# Patient Record
Sex: Male | Born: 1940 | Race: White | Hispanic: No | Marital: Married | Smoking: Former smoker
Health system: Southern US, Community
[De-identification: ages and names within clinical notes are randomized; demographics above are authoritative.]

## PROBLEM LIST (undated history)

## (undated) DIAGNOSIS — C61 Malignant neoplasm of prostate: Secondary | ICD-10-CM

## (undated) DIAGNOSIS — Z87442 Personal history of urinary calculi: Secondary | ICD-10-CM

## (undated) DIAGNOSIS — I10 Essential (primary) hypertension: Secondary | ICD-10-CM

## (undated) DIAGNOSIS — T7840XA Allergy, unspecified, initial encounter: Secondary | ICD-10-CM

## (undated) DIAGNOSIS — H269 Unspecified cataract: Secondary | ICD-10-CM

## (undated) DIAGNOSIS — M545 Low back pain, unspecified: Secondary | ICD-10-CM

## (undated) DIAGNOSIS — D126 Benign neoplasm of colon, unspecified: Secondary | ICD-10-CM

## (undated) DIAGNOSIS — K648 Other hemorrhoids: Secondary | ICD-10-CM

## (undated) DIAGNOSIS — K760 Fatty (change of) liver, not elsewhere classified: Secondary | ICD-10-CM

## (undated) DIAGNOSIS — K4090 Unilateral inguinal hernia, without obstruction or gangrene, not specified as recurrent: Secondary | ICD-10-CM

## (undated) DIAGNOSIS — N400 Enlarged prostate without lower urinary tract symptoms: Secondary | ICD-10-CM

## (undated) DIAGNOSIS — N281 Cyst of kidney, acquired: Secondary | ICD-10-CM

## (undated) DIAGNOSIS — I700 Atherosclerosis of aorta: Secondary | ICD-10-CM

## (undated) DIAGNOSIS — K8020 Calculus of gallbladder without cholecystitis without obstruction: Secondary | ICD-10-CM

## (undated) HISTORY — DX: Atherosclerosis of aorta: I70.0

## (undated) HISTORY — PX: PROSTATE BIOPSY: SHX241

## (undated) HISTORY — DX: Personal history of urinary calculi: Z87.442

## (undated) HISTORY — DX: Calculus of gallbladder without cholecystitis without obstruction: K80.20

## (undated) HISTORY — DX: Low back pain, unspecified: M54.50

## (undated) HISTORY — DX: Essential (primary) hypertension: I10

## (undated) HISTORY — DX: Unspecified cataract: H26.9

## (undated) HISTORY — DX: Benign neoplasm of colon, unspecified: D12.6

## (undated) HISTORY — DX: Benign prostatic hyperplasia without lower urinary tract symptoms: N40.0

## (undated) HISTORY — DX: Unilateral inguinal hernia, without obstruction or gangrene, not specified as recurrent: K40.90

## (undated) HISTORY — PX: POLYPECTOMY: SHX149

## (undated) HISTORY — DX: Allergy, unspecified, initial encounter: T78.40XA

## (undated) HISTORY — PX: COLONOSCOPY: SHX174

## (undated) HISTORY — PX: OTHER SURGICAL HISTORY: SHX169

## (undated) HISTORY — DX: Cyst of kidney, acquired: N28.1

## (undated) HISTORY — PX: HEMORRHOID SURGERY: SHX153

## (undated) HISTORY — DX: Other hemorrhoids: K64.8

## (undated) HISTORY — DX: Low back pain: M54.5

## (undated) HISTORY — DX: Fatty (change of) liver, not elsewhere classified: K76.0

---

## 2003-03-05 LAB — HM COLONOSCOPY

## 2004-12-07 ENCOUNTER — Ambulatory Visit: Payer: Self-pay | Admitting: Internal Medicine

## 2004-12-13 ENCOUNTER — Ambulatory Visit: Payer: Self-pay | Admitting: Internal Medicine

## 2005-12-12 ENCOUNTER — Ambulatory Visit: Payer: Self-pay | Admitting: Internal Medicine

## 2005-12-14 ENCOUNTER — Ambulatory Visit: Payer: Self-pay | Admitting: Internal Medicine

## 2006-04-18 ENCOUNTER — Ambulatory Visit: Payer: Self-pay | Admitting: Internal Medicine

## 2007-06-17 ENCOUNTER — Ambulatory Visit: Payer: Self-pay | Admitting: Internal Medicine

## 2007-06-17 LAB — CONVERTED CEMR LAB
Alkaline Phosphatase: 47 units/L (ref 39–117)
BUN: 11 mg/dL (ref 6–23)
Basophils Relative: 0.1 % (ref 0.0–1.0)
CO2: 29 meq/L (ref 19–32)
Creatinine, Ser: 0.8 mg/dL (ref 0.4–1.5)
Eosinophils Relative: 2.9 % (ref 0.0–5.0)
HCT: 41.7 % (ref 39.0–52.0)
Hemoglobin: 14.6 g/dL (ref 13.0–17.0)
Leukocytes, UA: NEGATIVE
Monocytes Absolute: 0.5 10*3/uL (ref 0.2–0.7)
Monocytes Relative: 8.3 % (ref 3.0–11.0)
Neutrophils Relative %: 66.7 % (ref 43.0–77.0)
Nitrite: NEGATIVE
Potassium: 4.4 meq/L (ref 3.5–5.1)
RDW: 12.2 % (ref 11.5–14.6)
Specific Gravity, Urine: 1.02 (ref 1.000–1.03)
TSH: 1.67 microintl units/mL (ref 0.35–5.50)
Total Bilirubin: 0.9 mg/dL (ref 0.3–1.2)
Total Protein, Urine: NEGATIVE mg/dL
Total Protein: 6.8 g/dL (ref 6.0–8.3)
Urobilinogen, UA: 0.2 (ref 0.0–1.0)
VLDL: 9 mg/dL (ref 0–40)
WBC: 5.8 10*3/uL (ref 4.5–10.5)
pH: 6 (ref 5.0–8.0)

## 2007-06-18 ENCOUNTER — Encounter: Payer: Self-pay | Admitting: Internal Medicine

## 2007-06-18 DIAGNOSIS — M545 Low back pain, unspecified: Secondary | ICD-10-CM | POA: Insufficient documentation

## 2007-06-18 DIAGNOSIS — N400 Enlarged prostate without lower urinary tract symptoms: Secondary | ICD-10-CM

## 2007-06-18 DIAGNOSIS — Z87442 Personal history of urinary calculi: Secondary | ICD-10-CM | POA: Insufficient documentation

## 2007-06-18 DIAGNOSIS — I10 Essential (primary) hypertension: Secondary | ICD-10-CM

## 2007-06-18 DIAGNOSIS — Z8719 Personal history of other diseases of the digestive system: Secondary | ICD-10-CM | POA: Insufficient documentation

## 2007-09-26 DIAGNOSIS — B029 Zoster without complications: Secondary | ICD-10-CM | POA: Insufficient documentation

## 2007-09-27 ENCOUNTER — Ambulatory Visit: Payer: Self-pay | Admitting: Family Medicine

## 2008-03-03 ENCOUNTER — Ambulatory Visit: Payer: Self-pay | Admitting: Internal Medicine

## 2008-03-03 DIAGNOSIS — J019 Acute sinusitis, unspecified: Secondary | ICD-10-CM

## 2008-03-03 DIAGNOSIS — J309 Allergic rhinitis, unspecified: Secondary | ICD-10-CM

## 2008-06-29 ENCOUNTER — Ambulatory Visit: Payer: Self-pay | Admitting: Internal Medicine

## 2008-06-29 LAB — CONVERTED CEMR LAB
ALT: 21 units/L (ref 0–53)
Alkaline Phosphatase: 52 units/L (ref 39–117)
Bilirubin Urine: NEGATIVE
Bilirubin, Direct: 0.2 mg/dL (ref 0.0–0.3)
CO2: 30 meq/L (ref 19–32)
GFR calc Af Amer: 125 mL/min
Glucose, Bld: 104 mg/dL — ABNORMAL HIGH (ref 70–99)
HDL: 50.3 mg/dL (ref >39.0)
Hemoglobin, Urine: NEGATIVE
Leukocytes, UA: NEGATIVE
Lymphocytes Relative: 23.1 % (ref 12.0–46.0)
Monocytes Relative: 9.7 % (ref 3.0–12.0)
Neutrophils Relative %: 63.6 % (ref 43.0–77.0)
Nitrite: NEGATIVE
Platelets: 180 10*3/uL (ref 150–400)
Potassium: 4.5 meq/L (ref 3.5–5.1)
RDW: 12 % (ref 11.5–14.6)
Sodium: 142 meq/L (ref 135–145)
Total Bilirubin: 1 mg/dL (ref 0.3–1.2)
Total CHOL/HDL Ratio: 3
Total Protein: 6.7 g/dL (ref 6.0–8.3)
Urobilinogen, UA: 0.2 (ref 0.0–1.0)
VLDL: 13 mg/dL (ref 0–40)
pH: 6 (ref 5.0–8.0)

## 2008-07-06 ENCOUNTER — Ambulatory Visit: Payer: Self-pay | Admitting: Internal Medicine

## 2008-07-06 DIAGNOSIS — R42 Dizziness and giddiness: Secondary | ICD-10-CM | POA: Insufficient documentation

## 2009-06-02 ENCOUNTER — Telehealth: Payer: Self-pay | Admitting: Internal Medicine

## 2009-06-30 ENCOUNTER — Ambulatory Visit: Payer: Self-pay | Admitting: Internal Medicine

## 2010-06-29 ENCOUNTER — Encounter: Payer: Self-pay | Admitting: Internal Medicine

## 2010-06-29 ENCOUNTER — Ambulatory Visit: Payer: Self-pay | Admitting: Internal Medicine

## 2010-06-29 LAB — CONVERTED CEMR LAB
ALT: 21 units/L (ref 0–53)
AST: 32 units/L (ref 0–37)
Albumin: 4.3 g/dL (ref 3.5–5.2)
BUN: 15 mg/dL (ref 6–23)
Chloride: 105 meq/L (ref 96–112)
Cholesterol: 168 mg/dL (ref 0–200)
Eosinophils Relative: 2.5 % (ref 0.0–5.0)
GFR calc non Af Amer: 101.07 mL/min (ref >60)
Glucose, Bld: 92 mg/dL (ref 70–99)
HCT: 41.2 % (ref 39.0–52.0)
Hemoglobin: 14.3 g/dL (ref 13.0–17.0)
Leukocytes, UA: NEGATIVE
Lymphs Abs: 1.2 10*3/uL (ref 0.7–4.0)
MCV: 93.1 fL (ref 78.0–100.0)
Monocytes Absolute: 0.5 10*3/uL (ref 0.1–1.0)
Monocytes Relative: 10.6 % (ref 3.0–12.0)
Neutro Abs: 3.3 10*3/uL (ref 1.4–7.7)
Nitrite: NEGATIVE
PSA: 1.32 ng/mL (ref 0.10–4.00)
Platelets: 160 10*3/uL (ref 150.0–400.0)
Potassium: 5 meq/L (ref 3.5–5.1)
RDW: 13.1 % (ref 11.5–14.6)
Sodium: 140 meq/L (ref 135–145)
Specific Gravity, Urine: 1.01 (ref 1.000–1.030)
Total Bilirubin: 0.7 mg/dL (ref 0.3–1.2)
Total Protein, Urine: NEGATIVE mg/dL
Triglycerides: 35 mg/dL (ref 0.0–149.0)
pH: 5.5 (ref 5.0–8.0)

## 2010-06-30 ENCOUNTER — Encounter: Payer: Self-pay | Admitting: Internal Medicine

## 2010-06-30 ENCOUNTER — Telehealth (INDEPENDENT_AMBULATORY_CARE_PROVIDER_SITE_OTHER): Payer: Self-pay | Admitting: *Deleted

## 2010-07-04 ENCOUNTER — Ambulatory Visit: Payer: Self-pay

## 2010-07-04 ENCOUNTER — Encounter: Payer: Self-pay | Admitting: Internal Medicine

## 2010-11-12 LAB — CONVERTED CEMR LAB
Albumin: 4.2 g/dL (ref 3.5–5.2)
Alkaline Phosphatase: 46 units/L (ref 39–117)
Basophils Absolute: 0 10*3/uL (ref 0.0–0.1)
Basophils Relative: 0.8 % (ref 0.0–3.0)
CO2: 31 meq/L (ref 19–32)
Calcium: 9.1 mg/dL (ref 8.4–10.5)
Chloride: 105 meq/L (ref 96–112)
Cholesterol: 156 mg/dL (ref 0–200)
Eosinophils Absolute: 0.1 10*3/uL (ref 0.0–0.7)
Glucose, Bld: 98 mg/dL (ref 70–99)
HDL: 67.9 mg/dL (ref >39.00)
Hemoglobin: 14.3 g/dL (ref 13.0–17.0)
Ketones, ur: NEGATIVE mg/dL
Lymphocytes Relative: 21.2 % (ref 12.0–46.0)
Lymphs Abs: 1.1 10*3/uL (ref 0.7–4.0)
MCHC: 33.8 g/dL (ref 30.0–36.0)
MCV: 94.3 fL (ref 78.0–100.0)
Monocytes Absolute: 0.5 10*3/uL (ref 0.1–1.0)
Neutro Abs: 3.5 10*3/uL (ref 1.4–7.7)
RDW: 12.5 % (ref 11.5–14.6)
Sodium: 141 meq/L (ref 135–145)
Specific Gravity, Urine: <=1.005 (ref 1.000–1.030)
Total Protein, Urine: NEGATIVE mg/dL
Total Protein: 7.2 g/dL (ref 6.0–8.3)
Triglycerides: 45 mg/dL (ref 0.0–149.0)
Urine Glucose: NEGATIVE mg/dL
Urobilinogen, UA: 0.2 (ref 0.0–1.0)
VLDL: 9 mg/dL (ref 0.0–40.0)
pH: 6 (ref 5.0–8.0)

## 2010-11-14 NOTE — Miscellaneous (Signed)
Summary: Orders Update  Clinical Lists Changes  Orders: Added new Test order of Carotid Duplex (Carotid Duplex) - Signed 

## 2010-11-14 NOTE — Assessment & Plan Note (Signed)
Summary: FU---BP MEDS---STC   Vital Signs:  Patient profile:   70 year old male Height:      72 inches Weight:      194.38 pounds BMI:     26.46 O2 Sat:      97 % on Room air Temp:     98.3 degrees F oral Pulse rate:   61 / minute BP sitting:   120 / 68  (left arm) Cuff size:   regular  Vitals Entered By: Zella Ball Ewing CMA Duncan Dull) (June 29, 2010 9:34 AM)  O2 Flow:  Room air  CC: followup on BP medication/RE   CC:  followup on BP medication/RE.  History of Present Illness: overall doing well;  Pt denies CP, worsening sob, doe, wheezing, orthopnea, pnd, worsening LE edema, palps, or  syncope   Pt denies new neuro symptoms such as headache, facial or extremity weakness  No fever, wt loss, night sweats, loss of appetite or other constitutional symptoms .  Did have an unsusual episode of significant dizziness for 2 hrs last wk different from his usual vertigo, not assoc with CP or syncope, and without recurrence.  Due for med refill.  Preventive Screening-Counseling & Management      Drug Use:  no.    Problems Prior to Update: 1)  Dizziness  (ICD-780.4) 2)  Preventive Health Care  (ICD-V70.0) 3)  Vertigo  (ICD-780.4) 4)  Preventive Health Care  (ICD-V70.0) 5)  Nephrolithiasis, Hx of  (ICD-V13.01) 6)  Allergic Rhinitis  (ICD-477.9) 7)  Sinusitis- Acute-nos  (ICD-461.9) 8)  Herpes Zoster  (ICD-053.9) 9)  Benign Prostatic Hypertrophy  (ICD-600.00) 10)  Low Back Pain  (ICD-724.2) 11)  Hypertension  (ICD-401.9) 12)  Hemorrhoids, Hx of  (ICD-V12.79) 13)  Renal Calculus, Hx of  (ICD-V13.01)  Medications Prior to Update: 1)  Ramipril 5 Mg  Caps (Ramipril) .... Take 1 Tablet By Mouth Once A Day 2)  Adult Aspirin Low Strength 81 Mg  Tbdp (Aspirin) .... Take 1 Tablet By Mouth Once A Day  Current Medications (verified): 1)  Ramipril 5 Mg  Caps (Ramipril) .... Take 1 Tablet By Mouth Once A Day 2)  Adult Aspirin Low Strength 81 Mg  Tbdp (Aspirin) .... Take 1 Tablet By Mouth Once A  Day  Allergies (verified): 1)  ! * Ivp Dye  Past History:  Past Medical History: Last updated: 03/03/2008 Hypertension Low back pain Benign prostatic hypertrophy Allergic rhinitis Nephrolithiasis, hx of  Past Surgical History: Last updated: 03/03/2008 Denies surgical history  Family History: Last updated: 03/03/2008 COPD psoriasis CAD DM - mother renal cancer - grandfather  Social History: Last updated: 06/29/2010 semi-retired - [employer] - works part time but has full benefits Never Smoked Alcohol use-yes 2 children Married Drug use-no  Risk Factors: Smoking Status: never (03/03/2008)  Social History: Reviewed history from 03/03/2008 and no changes required. semi-retired - [employer] - works part time but has full benefits Never Smoked Alcohol use-yes 2 children Married Drug use-no Drug Use:  no  Review of Systems  The patient denies anorexia, fever, weight loss, weight gain, vision loss, decreased hearing, hoarseness, chest pain, syncope, dyspnea on exertion, peripheral edema, prolonged cough, headaches, hemoptysis, abdominal pain, melena, hematochezia, severe indigestion/heartburn, hematuria, muscle weakness, suspicious skin lesions, difficulty walking, depression, unusual weight change, abnormal bleeding, enlarged lymph nodes, and angioedema.         all otherwise negative per pt -    Physical Exam  General:  alert and overweight-appearing.   Head:  normocephalic  and atraumatic.   Eyes:  vision grossly intact, pupils equal, and pupils round.   Ears:  R ear normal and L ear normal.   Nose:  no external deformity and no nasal discharge.   Mouth:  no gingival abnormalities and pharynx pink and moist.   Neck:  supple and no masses.   Lungs:  normal respiratory effort and normal breath sounds.   Heart:  normal rate and regular rhythm.   Abdomen:  soft, non-tender, and normal bowel sounds.   Msk:  no joint tenderness and no joint swelling.   Extremities:  no  edema, no erythema  Neurologic:  cranial nerves II-XII intact and strength normal in all extremities.     Impression & Recommendations:  Problem # 1:  Preventive Health Care (ICD-V70.0)  Overall doing well, age appropriate education and counseling updated and referral for appropriate preventive services done unless declined, immunizations up to date or declined, diet counseling done if overweight, urged to quit smoking if smokes , most recent labs reviewed and current ordered if appropriate, ecg reviewed or declined (interpretation per ECG scanned in the EMR if done); information regarding Medicare Prevention requirements given if appropriate; speciality referrals updated as appropriate   Orders: TLB-BMP (Basic Metabolic Panel-BMET) (80048-METABOL) TLB-CBC Platelet - w/Differential (85025-CBCD) TLB-Hepatic/Liver Function Pnl (80076-HEPATIC) TLB-Lipid Panel (80061-LIPID) TLB-TSH (Thyroid Stimulating Hormone) (84443-TSH) TLB-PSA (Prostate Specific Antigen) (84153-PSA) TLB-Udip ONLY (81003-UDIP)  Problem # 2:  DIZZINESS (ICD-780.4)  differerent from his vertigo in the past , lasted 2 hrs and resolved 1 day last wk without assoc symtpoms or recurrence - for ecg and carotids   Orders: EKG w/ Interpretation (93000) Radiology Referral (Radiology)  Problem # 3:  HYPERTENSION (ICD-401.9)  His updated medication list for this problem includes:    Ramipril 5 Mg Caps (Ramipril) .Marland Kitchen... Take 1 tablet by mouth once a day  BP today: 120/68 Prior BP: 122/74 (06/30/2009)  Labs Reviewed: K+: 4.6 (06/30/2009) Creat: : 0.8 (06/30/2009)   Chol: 156 (06/30/2009)   HDL: 67.90 (06/30/2009)   LDL: 79 (06/30/2009)   TG: 45.0 (06/30/2009) stable overall by hx and exam, ok to continue meds/tx as is   Complete Medication List: 1)  Ramipril 5 Mg Caps (Ramipril) .... Take 1 tablet by mouth once a day 2)  Adult Aspirin Low Strength 81 Mg Tbdp (Aspirin) .... Take 1 tablet by mouth once a day  Other  Orders: Tdap => 15yrs IM (24401) Admin 1st Vaccine (02725)  Patient Instructions: 1)  you had the tetanus shot today 2)  Your EKG was Ok today 3)  You will be contacted about the referral(s) to: carotid ultrasound 4)  Please go to the Lab in the basement for your blood and/or urine tests today 5)  Please call the number on the [facility] Card for results of your testing 6)  You are given the refills on the medication today 7)  Please schedule a follow-up appointment in 1 year, or sooner if needed Prescriptions: RAMIPRIL 5 MG  CAPS (RAMIPRIL) Take 1 tablet by mouth once a day  #90 x 3   Entered and Authorized by:   Corwin Levins MD   Signed by:   Corwin Levins MD on 06/29/2010   Method used:   Print then Give to Patient   RxID:   [id number]    Immunizations Administered:  Tetanus Vaccine:    Vaccine Type: Tdap    Site: left deltoid    Mfr: GlaxoSmithKline    Dose: 0.5 ml  Route: IM    Given by: Zella Ball Ewing CMA (AAMA)    Exp. Date: 04/13/2012    Lot #: [id number]    VIS given: 09/01/08 version given June 29, 2010.

## 2010-11-14 NOTE — Progress Notes (Signed)
----   Converted from flag ---- ---- 06/29/2010 2:09 PM, Edman Circle wrote: appt 9/20@ 4:00  ---- 06/29/2010 1:54 PM, Dagoberto Reef wrote: Pt need carotid dopplers.   Thanks ------------------------------

## 2010-11-27 ENCOUNTER — Telehealth: Payer: Self-pay | Admitting: Internal Medicine

## 2010-12-06 NOTE — Progress Notes (Signed)
Summary: rx refill req  Phone Note Call from Patient Call back at Home Phone [phone]   Caller: Patient Summary of Call: Pt left message stating that he needs new rx for his Ramipril to go to [facility] pt, his insurance changed and [facility] does not have his information on file Initial call taken by: Brenton Grills CMA Duncan Dull),  November 27, 2010 2:47 PM  Follow-up for Phone Call        rx sent to [facility]-pt informed Follow-up by: Brenton Grills CMA Duncan Dull),  November 27, 2010 2:50 PM    Prescriptions: RAMIPRIL 5 MG  CAPS (RAMIPRIL) Take 1 tablet by mouth once a day  #90 x 1   Entered by:   Brenton Grills CMA (AAMA)   Authorized by:   Corwin Levins MD   Signed by:   Brenton Grills CMA (AAMA) on 11/27/2010   Method used:   Electronically to        [facility] (retail)             ,          Ph: [phone]       Fax: [phone]   RxID:   [id number]

## 2011-06-25 ENCOUNTER — Other Ambulatory Visit: Payer: Self-pay

## 2011-06-25 ENCOUNTER — Other Ambulatory Visit: Payer: Self-pay | Admitting: Internal Medicine

## 2011-06-25 MED ORDER — RAMIPRIL 5 MG PO CAPS: 5.0000 mg | ORAL_CAPSULE | Freq: Every day | ORAL | Status: DC

## 2011-08-10 ENCOUNTER — Encounter: Payer: Self-pay | Admitting: Internal Medicine

## 2011-08-10 DIAGNOSIS — Z0001 Encounter for general adult medical examination with abnormal findings: Secondary | ICD-10-CM | POA: Insufficient documentation

## 2011-08-10 DIAGNOSIS — Z0000 Encounter for general adult medical examination without abnormal findings: Secondary | ICD-10-CM | POA: Insufficient documentation

## 2011-08-13 ENCOUNTER — Other Ambulatory Visit (INDEPENDENT_AMBULATORY_CARE_PROVIDER_SITE_OTHER): Payer: Medicare Other

## 2011-08-13 ENCOUNTER — Ambulatory Visit (INDEPENDENT_AMBULATORY_CARE_PROVIDER_SITE_OTHER): Payer: Medicare Other | Admitting: Internal Medicine

## 2011-08-13 ENCOUNTER — Encounter: Payer: Self-pay | Admitting: Internal Medicine

## 2011-08-13 VITALS — BP 120/78 | HR 57 | Temp 97.9°F | Ht 72.0 in | Wt 192.0 lb

## 2011-08-13 DIAGNOSIS — I10 Essential (primary) hypertension: Secondary | ICD-10-CM

## 2011-08-13 DIAGNOSIS — Z23 Encounter for immunization: Secondary | ICD-10-CM

## 2011-08-13 DIAGNOSIS — R5381 Other malaise: Secondary | ICD-10-CM

## 2011-08-13 DIAGNOSIS — R5383 Other fatigue: Secondary | ICD-10-CM | POA: Insufficient documentation

## 2011-08-13 DIAGNOSIS — J309 Allergic rhinitis, unspecified: Secondary | ICD-10-CM

## 2011-08-13 DIAGNOSIS — N320 Bladder-neck obstruction: Secondary | ICD-10-CM | POA: Insufficient documentation

## 2011-08-13 DIAGNOSIS — M545 Low back pain: Secondary | ICD-10-CM

## 2011-08-13 LAB — URINALYSIS, ROUTINE W REFLEX MICROSCOPIC
Bilirubin Urine: NEGATIVE
Ketones, ur: NEGATIVE
Urine Glucose: NEGATIVE
Urobilinogen, UA: 0.2 (ref 0.0–1.0)

## 2011-08-13 LAB — BASIC METABOLIC PANEL
CO2: 26 mEq/L (ref 19–32)
Calcium: 9.1 mg/dL (ref 8.4–10.5)
Creatinine, Ser: 0.9 mg/dL (ref 0.4–1.5)

## 2011-08-13 LAB — LIPID PANEL
HDL: 70 mg/dL (ref >39.00)
LDL Cholesterol: 80 mg/dL (ref 0–99)
Total CHOL/HDL Ratio: 2
Triglycerides: 45 mg/dL (ref 0.0–149.0)
VLDL: 9 mg/dL (ref 0.0–40.0)

## 2011-08-13 LAB — CBC WITH DIFFERENTIAL/PLATELET
Basophils Relative: 0.6 % (ref 0.0–3.0)
Eosinophils Absolute: 0.2 10*3/uL (ref 0.0–0.7)
Hemoglobin: 13.8 g/dL (ref 13.0–17.0)
Lymphocytes Relative: 13.5 % (ref 12.0–46.0)
MCHC: 34.2 g/dL (ref 30.0–36.0)
Neutro Abs: 5 10*3/uL (ref 1.4–7.7)
RBC: 4.28 Mil/uL (ref 4.22–5.81)

## 2011-08-13 LAB — HEPATIC FUNCTION PANEL
Bilirubin, Direct: 0.2 mg/dL (ref 0.0–0.3)
Total Bilirubin: 0.7 mg/dL (ref 0.3–1.2)
Total Protein: 6.7 g/dL (ref 6.0–8.3)

## 2011-08-13 MED ORDER — RAMIPRIL 5 MG PO CAPS: 5.0000 mg | ORAL_CAPSULE | Freq: Every day | ORAL | Status: DC

## 2011-08-13 NOTE — Assessment & Plan Note (Addendum)
stable overall by hx and exam, most recent data reviewed with pt, and pt to continue medical treatment as before  BP Readings from Last 3 Encounters:  08/13/11 120/78  06/29/10 120/68  06/30/09 122/74   ECG reviewed as per emr

## 2011-08-13 NOTE — Patient Instructions (Addendum)
You had the flu shot today Please go to LAB in the Basement for the blood and/or urine tests to be done today Please call the phone number [phone] (the PhoneTree System) for results of testing in 2-3 days;  When calling, simply dial the number, and when prompted enter the MRN number above (the Medical Record Number) and the # key, then the message should start. Your medication was refilled to the pharmacy today - [facility] Please return in 1 year for your yearly visit, or sooner if needed

## 2011-08-13 NOTE — Assessment & Plan Note (Signed)
stable overall by hx and exam, most recent data reviewed with pt, and pt to continue medical treatment as before , OK for otc allegra prn

## 2011-08-13 NOTE — Progress Notes (Signed)
  Subjective:    Patient ID: Austin [name], male    DOB: Jan 12, 1941, 70 y.o.   MRN: [id number]  HPI  Here to f/u; overall doing well, Does have sense of ongoing fatigue, but denies signficant hypersomnolence.  Pt denies chest pain, increased sob or doe, wheezing, orthopnea, PND, increased LE swelling, palpitations, dizziness or syncope.  Pt denies new neurological symptoms such as new headache, or facial or extremity weakness or numbness    Pt denies polydipsia, polyuria.  Overall good compliance with treatment, and good medicine tolerability.   Pt denies fever, wt loss, night sweats, loss of appetite, or other constitutional symptoms  Allergy symptoms minimal this season, and has not had to use OTC meds.  Pt continues to have recurring but mild, infreqeunt LBP without change in severity, bowel or bladder change, fever, wt loss,  worsening LE pain/numbness/weakness, gait change or falls. Past Medical History  Diagnosis Date  . Hypertension   . Low back pain   . BPH (benign prostatic hypertrophy)   . Allergy   . History of nephrolithiasis    Past Surgical History  Procedure Date  . None     reports that he has never smoked. He does not have any smokeless tobacco history on file. He reports that he drinks alcohol. He reports that he does not use illicit drugs. family history includes COPD in his other; Cancer in his other; Diabetes in his mother; Heart disease in his other; and Psoriasis in his other. No Known Allergies Current Outpatient Prescriptions on File Prior to Visit  Medication Sig Dispense Refill  . aspirin 81 MG tablet Take 81 mg by mouth daily.          Review of Systems Review of Systems  Constitutional: Negative for diaphoresis and unexpected weight change.  HENT: Negative for drooling and tinnitus.   Eyes: Negative for photophobia and visual disturbance.  Respiratory: Negative for choking and stridor.   Gastrointestinal: Negative for vomiting and blood in stool.    Genitourinary: Negative for hematuria and decreased urine volume.  Musculoskeletal: Negative for gait problem.  Skin: Negative for color change and wound.  Neurological: Negative for tremors and numbness.  Psychiatric/Behavioral: Negative for decreased concentration. The patient is not hyperactive.      Objective:   Physical Exam BP 120/78  Pulse 57  Temp(Src) 97.9 F (36.6 C) (Oral)  Ht 6' (1.829 m)  Wt 192 lb (87.091 kg)  BMI 26.04 kg/m2  SpO2 97% Physical Exam  VS noted Constitutional: Pt appears well-developed and well-nourished.  HENT: Head: Normocephalic.  Right Ear: External ear normal.  Left Ear: External ear normal.  Eyes: Conjunctivae and EOM are normal. Pupils are equal, round, and reactive to light.  Neck: Normal range of motion. Neck supple.  Cardiovascular: Normal rate and regular rhythm.   Pulmonary/Chest: Effort normal and breath sounds normal.  Abd:  Soft, NT, non-distended, + BS Neurological: Pt is alert. No cranial nerve deficit.  Skin: Skin is warm. No erythema.  Psychiatric: Pt behavior is normal. Thought content normal.     Assessment & Plan:

## 2011-08-13 NOTE — Assessment & Plan Note (Signed)
asymtp - for PSA and UA today,  to f/u any worsening symptoms or concerns

## 2011-08-13 NOTE — Assessment & Plan Note (Signed)
Etiology unclear, Exam otherwise benign, to check labs as documented, follow with expectant management  

## 2011-08-13 NOTE — Assessment & Plan Note (Signed)
stable overall by hx and exam, , and pt to continue medical treatment as before   

## 2011-11-16 ENCOUNTER — Telehealth: Payer: Self-pay

## 2011-11-16 NOTE — Telephone Encounter (Signed)
I would try the OTC meds first, such as Delsym, or mucinex otc prn for cough and congestion, as this often helps

## 2011-11-16 NOTE — Telephone Encounter (Signed)
Patient informed. 

## 2011-11-16 NOTE — Telephone Encounter (Signed)
Patient has cough and congestion. He would like cough med. And antibiotic if possible to [facility] [address] [phone]

## 2011-11-22 ENCOUNTER — Other Ambulatory Visit: Payer: Self-pay

## 2011-11-22 ENCOUNTER — Telehealth: Payer: Self-pay | Admitting: Internal Medicine

## 2011-11-22 MED ORDER — AZITHROMYCIN 250 MG PO TABS: ORAL_TABLET | ORAL | Status: AC

## 2011-11-22 MED ORDER — HYDROCODONE-HOMATROPINE 5-1.5 MG/5ML PO SYRP: 5.0000 mL | ORAL_SOLUTION | Freq: Four times a day (QID) | ORAL | Status: AC | PRN

## 2011-11-22 MED ORDER — AZITHROMYCIN 250 MG PO TABS: ORAL_TABLET | ORAL | Status: DC

## 2011-11-22 NOTE — Telephone Encounter (Signed)
Faxed hardcopy to pharmacy. Called the patient to inform and he is also requesting an antibiotic as well. Stated he has no fever, chest burning due to all the coughing. Please advise

## 2011-11-22 NOTE — Telephone Encounter (Signed)
Done hardcopy to robin  

## 2011-11-22 NOTE — Telephone Encounter (Signed)
The pt called and is hoping to get a rx for a cough syrup.  He states he tried an Secretary/administrator and it did not work.  Thanks!

## 2011-11-22 NOTE — Telephone Encounter (Signed)
Done per emr 

## 2011-11-22 NOTE — Telephone Encounter (Signed)
Informed the patient sent in.

## 2011-12-07 ENCOUNTER — Telehealth: Payer: Self-pay | Admitting: Internal Medicine

## 2011-12-07 MED ORDER — HYDROCODONE-HOMATROPINE 5-1.5 MG/5ML PO SYRP: 5.0000 mL | ORAL_SOLUTION | Freq: Four times a day (QID) | ORAL | Status: AC | PRN

## 2011-12-07 NOTE — Telephone Encounter (Signed)
Faxed hardcopy to pharmacy and informed the patient as well. 

## 2011-12-07 NOTE — Telephone Encounter (Signed)
Pt just seen feb 7  Ok this time only

## 2011-12-07 NOTE — Telephone Encounter (Signed)
Pt requesting hydrocodone -homatropine syrup refill--call in to  [facility] Ph#  [phone]

## 2012-07-21 ENCOUNTER — Other Ambulatory Visit (INDEPENDENT_AMBULATORY_CARE_PROVIDER_SITE_OTHER): Payer: Medicare Other

## 2012-07-21 ENCOUNTER — Ambulatory Visit (INDEPENDENT_AMBULATORY_CARE_PROVIDER_SITE_OTHER): Payer: Medicare Other | Admitting: Internal Medicine

## 2012-07-21 ENCOUNTER — Encounter: Payer: Self-pay | Admitting: Internal Medicine

## 2012-07-21 VITALS — BP 112/70 | HR 64 | Temp 97.0°F | Ht 72.0 in | Wt 188.2 lb

## 2012-07-21 DIAGNOSIS — Z23 Encounter for immunization: Secondary | ICD-10-CM

## 2012-07-21 DIAGNOSIS — Z0000 Encounter for general adult medical examination without abnormal findings: Secondary | ICD-10-CM

## 2012-07-21 DIAGNOSIS — Z125 Encounter for screening for malignant neoplasm of prostate: Secondary | ICD-10-CM

## 2012-07-21 DIAGNOSIS — Z79899 Other long term (current) drug therapy: Secondary | ICD-10-CM

## 2012-07-21 DIAGNOSIS — J019 Acute sinusitis, unspecified: Secondary | ICD-10-CM

## 2012-07-21 DIAGNOSIS — N400 Enlarged prostate without lower urinary tract symptoms: Secondary | ICD-10-CM

## 2012-07-21 LAB — URINALYSIS, ROUTINE W REFLEX MICROSCOPIC
Leukocytes, UA: NEGATIVE
Nitrite: NEGATIVE
Specific Gravity, Urine: 1.02 (ref 1.000–1.030)
Total Protein, Urine: NEGATIVE
pH: 6.5 (ref 5.0–8.0)

## 2012-07-21 MED ORDER — RAMIPRIL 5 MG PO CAPS: 5.0000 mg | ORAL_CAPSULE | Freq: Every day | ORAL | Status: DC

## 2012-07-21 MED ORDER — TAMSULOSIN HCL 0.4 MG PO CAPS: 0.4000 mg | ORAL_CAPSULE | Freq: Every day | ORAL | Status: DC

## 2012-07-21 MED ORDER — PNEUMOCOCCAL VAC POLYVALENT 25 MCG/0.5ML IJ INJ: 0.5000 mL | INJECTION | Freq: Once | INTRAMUSCULAR | Status: DC

## 2012-07-21 MED ORDER — LEVOFLOXACIN 250 MG PO TABS: 250.0000 mg | ORAL_TABLET | Freq: Every day | ORAL | Status: DC

## 2012-07-21 NOTE — Patient Instructions (Addendum)
You had the flu shot today, and the pneumonia shot Please check with your insurance about the shingles shot, and if covered, please return for nurse visit to have the shot Take all new medications as prescribed - the levaquin antibiotic, and the flomax for prostate Continue all other medications as before Please have the pharmacy call with any refills you may need. Please go to LAB in the Basement for the blood and/or urine tests to be done today You will be contacted by phone if any changes need to be made immediately.  Otherwise, you will receive a letter about your results with an explanation. Please remember to sign up for My Chart at your earliest convenience, as this will be important to you in the future with finding out test results. Please return in 1 year for your yearly visit, or sooner if needed, with Lab testing done 3-5 days before

## 2012-07-22 ENCOUNTER — Encounter: Payer: Self-pay | Admitting: Internal Medicine

## 2012-07-22 LAB — BASIC METABOLIC PANEL
BUN: 18 mg/dL (ref 6–23)
Calcium: 8.9 mg/dL (ref 8.4–10.5)
Creatinine, Ser: 0.9 mg/dL (ref 0.4–1.5)
GFR: 88.95 mL/min (ref >60.00)
Glucose, Bld: 78 mg/dL (ref 70–99)
Potassium: 4.9 mEq/L (ref 3.5–5.1)

## 2012-07-22 LAB — CBC WITH DIFFERENTIAL/PLATELET
Basophils Relative: 0.4 % (ref 0.0–3.0)
Eosinophils Relative: 3.1 % (ref 0.0–5.0)
HCT: 39.4 % (ref 39.0–52.0)
Lymphs Abs: 1.2 10*3/uL (ref 0.7–4.0)
MCV: 95.8 fl (ref 78.0–100.0)
Monocytes Relative: 8.1 % (ref 3.0–12.0)
Platelets: 190 10*3/uL (ref 150.0–400.0)
RBC: 4.11 Mil/uL — ABNORMAL LOW (ref 4.22–5.81)
WBC: 7.8 10*3/uL (ref 4.5–10.5)

## 2012-07-22 LAB — LIPID PANEL
Cholesterol: 147 mg/dL (ref 0–200)
HDL: 58.3 mg/dL (ref >39.00)
LDL Cholesterol: 74 mg/dL (ref 0–99)
Triglycerides: 75 mg/dL (ref 0.0–149.0)
VLDL: 15 mg/dL (ref 0.0–40.0)

## 2012-07-22 LAB — HEPATIC FUNCTION PANEL
AST: 20 U/L (ref 0–37)
Albumin: 3.8 g/dL (ref 3.5–5.2)
Total Bilirubin: 0.5 mg/dL (ref 0.3–1.2)

## 2012-07-22 LAB — PSA: PSA: 2.25 ng/mL (ref 0.10–4.00)

## 2012-07-22 LAB — TSH: TSH: 1.78 u[IU]/mL (ref 0.35–5.50)

## 2012-07-24 ENCOUNTER — Encounter: Payer: Self-pay | Admitting: Internal Medicine

## 2012-07-24 NOTE — Progress Notes (Signed)
Subjective:    Patient ID: Austin [name], male    DOB: [birth year], 71 y.o.   MRN: [id number]  HPI  Here for wellness and f/u;  Overall doing ok;  Pt denies CP, worsening SOB, DOE, wheezing, orthopnea, PND, worsening LE edema, palpitations, dizziness or syncope.  Pt denies neurological change such as new Headache, facial or extremity weakness.  Pt denies polydipsia, polyuria, or low sugar symptoms. Pt states overall good compliance with treatment and medications, good tolerability, and trying to follow lower cholesterol diet.  Pt denies worsening depressive symptoms, suicidal ideation or panic. No fever, wt loss, night sweats, loss of appetite, or other constitutional symptoms.  Pt states good ability with ADL's, low fall risk, home safety reviewed and adequate, no significant changes in hearing or vision, and occasionally active with exercise.   Here with 3 days acute onset fever, facial pain, pressure, general weakness and malaise, and greenish d/c, with slight ST, but little to no cough.  Has some urinary stream slowing , worse in the AM , some small better later in the day Past Medical History  Diagnosis Date  . Hypertension   . Low back pain   . BPH (benign prostatic hypertrophy)   . Allergy   . History of nephrolithiasis    Past Surgical History  Procedure Date  . None     reports that he has never smoked. He does not have any smokeless tobacco history on file. He reports that he drinks alcohol. He reports that he does not use illicit drugs. family history includes COPD in his other; Cancer in his other; Diabetes in his mother; Heart disease in his other; and Psoriasis in his other. No Known Allergies Current Outpatient Prescriptions on File Prior to Visit  Medication Sig Dispense Refill  . aspirin 81 MG tablet Take 81 mg by mouth daily.        . ramipril (ALTACE) 5 MG capsule Take 1 capsule (5 mg total) by mouth daily.  90 capsule  3   No current facility-administered medications on  file prior to visit.   Review of Systems Review of Systems  Constitutional: Negative for diaphoresis, activity change, appetite change and unexpected weight change.  HENT: Negative for hearing loss, ear pain, facial swelling, mouth sores and neck stiffness.   Eyes: Negative for pain, redness and visual disturbance.  Respiratory: Negative for shortness of breath and wheezing.   Cardiovascular: Negative for chest pain and palpitations.  Gastrointestinal: Negative for diarrhea, blood in stool, abdominal distention and rectal pain.  Genitourinary: Negative for hematuria, flank pain and decreased urine volume.  Musculoskeletal: Negative for myalgias and joint swelling.  Skin: Negative for color change and wound.  Neurological: Negative for syncope and numbness.  Hematological: Negative for adenopathy.  Psychiatric/Behavioral: Negative for hallucinations, self-injury, decreased concentration and agitation.      Objective:   Physical Exam BP 112/70  Pulse 64  Temp 97 F (36.1 C) (Oral)  Ht 6' (1.829 m)  Wt 188 lb 4 oz (85.39 kg)  BMI 25.53 kg/m2  SpO2 97% Physical Exam  VS noted Constitutional: Pt is oriented to person, place, and time. Appears well-developed and well-nourished.  HENT:  Head: Normocephalic and atraumatic.  Right Ear: External ear normal.  Left Ear: External ear normal.  Nose: Nose normal.  Mouth/Throat: Oropharynx is clear and moist.  Bilat tm's mild erythema.  Sinus tender bilat.  Pharynx mild erythema Eyes: Conjunctivae and EOM are normal. Pupils are equal, round, and reactive to light.  Neck: Normal range of motion. Neck supple. No JVD present. No tracheal deviation present.  Cardiovascular: Normal rate, regular rhythm, normal heart sounds and intact distal pulses.   Pulmonary/Chest: Effort normal and breath sounds normal.  Abdominal: Soft. Bowel sounds are normal. There is no tenderness.  Musculoskeletal: Normal range of motion. Exhibits no edema.    Lymphadenopathy:  Has no cervical adenopathy.  Neurological: Pt is alert and oriented to person, place, and time. Pt has normal reflexes. No cranial nerve deficit.  Skin: Skin is warm and dry. No rash noted.  Psychiatric:  Has  normal mood and affect. Behavior is normal.     Assessment & Plan:

## 2012-07-24 NOTE — Assessment & Plan Note (Signed)

## 2012-07-24 NOTE — Assessment & Plan Note (Signed)
Mild to mod, for antibx course,  to f/u any worsening symptoms or concerns 

## 2012-07-24 NOTE — Assessment & Plan Note (Signed)
For flomax trial - .4 qd

## 2013-07-22 ENCOUNTER — Encounter: Payer: Self-pay | Admitting: Family Medicine

## 2013-07-22 ENCOUNTER — Ambulatory Visit (INDEPENDENT_AMBULATORY_CARE_PROVIDER_SITE_OTHER): Payer: Medicare Other | Admitting: Internal Medicine

## 2013-07-22 ENCOUNTER — Ambulatory Visit (INDEPENDENT_AMBULATORY_CARE_PROVIDER_SITE_OTHER): Payer: Medicare Other | Admitting: Family Medicine

## 2013-07-22 ENCOUNTER — Encounter: Payer: Self-pay | Admitting: Internal Medicine

## 2013-07-22 VITALS — BP 122/80 | HR 65 | Wt 189.0 lb

## 2013-07-22 VITALS — BP 122/80 | HR 65 | Temp 97.4°F | Ht 73.0 in | Wt 189.2 lb

## 2013-07-22 DIAGNOSIS — M25561 Pain in right knee: Secondary | ICD-10-CM

## 2013-07-22 DIAGNOSIS — Z0000 Encounter for general adult medical examination without abnormal findings: Secondary | ICD-10-CM

## 2013-07-22 DIAGNOSIS — Z23 Encounter for immunization: Secondary | ICD-10-CM

## 2013-07-22 DIAGNOSIS — M25569 Pain in unspecified knee: Secondary | ICD-10-CM

## 2013-07-22 DIAGNOSIS — Z136 Encounter for screening for cardiovascular disorders: Secondary | ICD-10-CM

## 2013-07-22 MED ORDER — RAMIPRIL 5 MG PO CAPS: 5.0000 mg | ORAL_CAPSULE | Freq: Every day | ORAL | Status: DC

## 2013-07-22 MED ORDER — MELOXICAM 15 MG PO TABS: 15.0000 mg | ORAL_TABLET | Freq: Every day | ORAL | Status: DC

## 2013-07-22 NOTE — Assessment & Plan Note (Addendum)
Patient's right knee pain seems to be nonspecific. Patient does have medial joint line pain but on ultrasound no signs of meniscal injury or on physical exam that would be legitimate concern. I think patient did just tweaked his knee and does have a small effusion that seems to be resolving. Anti-inflammatories x10 days Icing Brace was given and fitted by me today. Patient will come back again in 3-4 weeks the pain is not completely resolved and we'll try a corticosteroid injection.

## 2013-07-22 NOTE — Progress Notes (Signed)
Subjective:    Patient ID: Austin [name], male    DOB: 08/21/1941, 72 y.o.   MRN: [id number]  HPI  Here for wellness and f/u;  Overall doing ok;  Pt denies CP, worsening SOB, DOE, wheezing, orthopnea, PND, worsening LE edema, palpitations, dizziness or syncope.  Pt denies neurological change such as new headache, facial or extremity weakness.  Pt denies polydipsia, polyuria, or low sugar symptoms. Pt states overall good compliance with treatment and medications, good tolerability, and has been trying to follow lower cholesterol diet.  Pt denies worsening depressive symptoms, suicidal ideation or panic. No fever, night sweats, wt loss, loss of appetite, or other constitutional symptoms.  Pt states good ability with ADL's, has low fall risk, home safety reviewed and adequate, no other significant changes in hearing or vision, and only occasionally active with exercise.  Also back from [place] recently, now with 2 wks onset right knee pain/swelling and feeling of instability with walking up on a curb. No falls Past Medical History  Diagnosis Date  . Hypertension   . Low back pain   . BPH (benign prostatic hypertrophy)   . Allergy   . History of nephrolithiasis    Past Surgical History  Procedure Laterality Date  . None      reports that he has never smoked. He does not have any smokeless tobacco history on file. He reports that he drinks alcohol. He reports that he does not use illicit drugs. family history includes COPD in his other; Cancer in his other; Diabetes in his mother; Heart disease in his other; Psoriasis in his other. No Known Allergies Current Outpatient Prescriptions on File Prior to Visit  Medication Sig Dispense Refill  . aspirin 81 MG tablet Take 81 mg by mouth daily.         No current facility-administered medications on file prior to visit.   Review of Systems Constitutional: Negative for diaphoresis, activity change, appetite change or unexpected weight change.  HENT:  Negative for hearing loss, ear pain, facial swelling, mouth sores and neck stiffness.   Eyes: Negative for pain, redness and visual disturbance.  Respiratory: Negative for shortness of breath and wheezing.   Cardiovascular: Negative for chest pain and palpitations.  Gastrointestinal: Negative for diarrhea, blood in stool, abdominal distention or other pain Genitourinary: Negative for hematuria, flank pain or change in urine volume.  Musculoskeletal: Negative for myalgias and joint swelling.  Skin: Negative for color change and wound.  Neurological: Negative for syncope and numbness. other than noted Hematological: Negative for adenopathy.  Psychiatric/Behavioral: Negative for hallucinations, self-injury, decreased concentration and agitation.      Objective:   Physical Exam BP 122/80  Pulse 65  Temp(Src) 97.4 F (36.3 C) (Oral)  Ht 6\' 1"  (1.854 m)  Wt 189 lb 4 oz (85.843 kg)  BMI 24.97 kg/m2  SpO2 97% VS noted,  Constitutional: Pt is oriented to person, place, and time. Appears well-developed and well-nourished.  Head: Normocephalic and atraumatic.  Right Ear: External ear normal.  Left Ear: External ear normal.  Nose: Nose normal.  Mouth/Throat: Oropharynx is clear and moist.  Eyes: Conjunctivae and EOM are normal. Pupils are equal, round, and reactive to light.  Neck: Normal range of motion. Neck supple. No JVD present. No tracheal deviation present.  Cardiovascular: Normal rate, regular rhythm, normal heart sounds and intact distal pulses.   Pulmonary/Chest: Effort normal and breath sounds normal.  Abdominal: Soft. Bowel sounds are normal. There is no tenderness. No HSM  Musculoskeletal:  Normal range of motion. Exhibits no edema.  Lymphadenopathy:  Has no cervical adenopathy.  Neurological: Pt is alert and oriented to person, place, and time. Pt has normal reflexes. No cranial nerve deficit.  Skin: Skin is warm and dry. No rash noted.  Psychiatric:  Has  normal mood and  affect. Behavior is normal.  Right knee with 1+ effusion, NT, decrsaed ROM    Assessment & Plan:

## 2013-07-22 NOTE — Assessment & Plan Note (Signed)

## 2013-07-22 NOTE — Patient Instructions (Addendum)
You had the Prevnar pneumonia shot, and the flu shot Please continue all other medications as before, and refills have been done if requested. Please have the pharmacy call with any other refills you may need. Please continue your efforts at being more active, low cholesterol diet, and weight control. You are otherwise up to date with prevention measures today. Please keep your appointments with your specialists as you may have planned  Please see the front desk now to be registered to see Dr Katrinka Blazing now for the right knee pain and swelling  Please go to the LAB in the Basement (turn left off the elevator) for the tests to be done at your convenience  Your EKG was OK today  You will be contacted by phone if any changes need to be made immediately.  Otherwise, you will receive a letter about your results with an explanation, but please check with MyChart first.  Please return in 1 year for your yearly visit, or sooner if needed, with Lab testing done 3-5 days before

## 2013-07-22 NOTE — Addendum Note (Signed)
Addended by: Scharlene Gloss B on: 07/22/2013 04:29 PM   Modules accepted: Orders

## 2013-07-22 NOTE — Progress Notes (Signed)
  I'm seeing this patient by the request  of:  Oliver Barre, MD  CC: Right knee pain  HPI: Patient is a 72 year old gentleman coming in with right knee pain for 3 weeks duration. Patient's 3 weeks ago was in [place] and came off a curb incorrectly. Patient the like he hyperextended his knee. Patient states that he continues to have pain when he is going downhill or going downstairs. Otherwise regular walking seems to give him no trouble. Patient denies any locking popping or giving on him. Patient denies any radiation of pain, numbness or any swelling in the knee. Patient states that he has more of a dull aching sensation that he would rate 5/10 pain. Patient denies any nighttime awakening. Patient has not done any home modalities at this time.  Past medical, surgical, family and social history reviewed. Medications reviewed all in the electronic medical record.   Review of Systems: No headache, visual changes, nausea, vomiting, diarrhea, constipation, dizziness, abdominal pain, skin rash, fevers, chills, night sweats, weight loss, swollen lymph nodes, body aches, joint swelling, muscle aches, chest pain, shortness of breath, mood changes.   Objective:    Blood pressure 122/80, pulse 65, weight 189 lb (85.73 kg), SpO2 97.00%.   General: No apparent distress alert and oriented x3 mood and affect normal, dressed appropriately.  HEENT: Pupils equal, extraocular movements intact Respiratory: Patient's speak in full sentences and does not appear short of breath Cardiovascular: No lower extremity edema, non tender, no erythema Skin: Warm dry intact with no signs of infection or rash on extremities or on axial skeleton. Abdomen: Soft nontender Neuro: Cranial nerves II through XII are intact, neurovascularly intact in all extremities with 2+ DTRs and 2+ pulses. Lymph: No lymphadenopathy of posterior or anterior cervical chain or axillae bilaterally.  Gait normal with good balance and coordination.   MSK: Non tender with full range of motion and good stability and symmetric strength and tone of shoulders, elbows, wrist, hip, and ankles bilaterally.  Knee: Right Normal to inspection with no erythema or obvious bony abnormalities. Patient does though have a trace effusion of this knee compared to the contralateral side. Palpation normal with no warmth, but does have mild medial joint line tenderness No patellar tenderness, or condyle tenderness. ROM full in flexion and extension and lower leg rotation. Ligaments with solid consistent endpoints including ACL, PCL, LCL, MCL. Negative Mcmurray's, Apley's, and Thessalonian tests. Non painful patellar compression. Patellar glide without crepitus. Patellar and quadriceps tendons unremarkable. Hamstring and quadriceps strength is normal.   MSK US performed of: Right knee This study was ordered, performed, and interpreted by Terrilee Files D.O.  Knee: All structures visualized. Anteromedial, anterolateral, posteromedial, and posterolateral menisci unremarkable without tearing, fraying, effusion, or displacement. Patient does have chronic degenerative changes the medial meniscus but no acute tear appreciated Patellar Tendon unremarkable on long and transverse views without effusion. No abnormality of prepatellar bursa. Patient does have trace effusion of the knee extending in the suprapatellar pouch LCL and MCL unremarkable on long and transverse views. No abnormality of origin of medial or lateral head of the gastrocnemius.  IMPRESSION:  Nonspecific knee swelling with no signs of meniscal injury.    Impression and Recommendations:     This case required medical decision making of moderate complexity.

## 2013-07-22 NOTE — Assessment & Plan Note (Signed)
To see Dr Katrinka Blazing in our office today/sports med

## 2013-07-22 NOTE — Patient Instructions (Addendum)
Very nice to meet you Next time you come I want pictures of your trip Ice 20 minute 2 times a day Exercises daily Wear the brace with activity and 30 minutes afterwards Meloxicam daily for 10 days then as needed Come back in 3-4 weeks.

## 2013-07-23 ENCOUNTER — Other Ambulatory Visit: Payer: Self-pay | Admitting: Internal Medicine

## 2013-07-23 ENCOUNTER — Other Ambulatory Visit (INDEPENDENT_AMBULATORY_CARE_PROVIDER_SITE_OTHER): Payer: Medicare Other

## 2013-07-23 DIAGNOSIS — Z0000 Encounter for general adult medical examination without abnormal findings: Secondary | ICD-10-CM

## 2013-07-23 DIAGNOSIS — I10 Essential (primary) hypertension: Secondary | ICD-10-CM

## 2013-07-23 DIAGNOSIS — R972 Elevated prostate specific antigen [PSA]: Secondary | ICD-10-CM

## 2013-07-23 LAB — URINALYSIS, ROUTINE W REFLEX MICROSCOPIC
Bilirubin Urine: NEGATIVE
Hgb urine dipstick: NEGATIVE
Leukocytes, UA: NEGATIVE
Nitrite: NEGATIVE
Specific Gravity, Urine: 1.01 (ref 1.000–1.030)
Total Protein, Urine: NEGATIVE
Urobilinogen, UA: 0.2 (ref 0.0–1.0)
WBC, UA: NONE SEEN (ref 0–?)

## 2013-07-23 LAB — CBC WITH DIFFERENTIAL/PLATELET
Basophils Absolute: 0 10*3/uL (ref 0.0–0.1)
Basophils Relative: 0.5 % (ref 0.0–3.0)
Eosinophils Absolute: 0.3 10*3/uL (ref 0.0–0.7)
Lymphocytes Relative: 18.1 % (ref 12.0–46.0)
MCHC: 33.9 g/dL (ref 30.0–36.0)
Neutrophils Relative %: 68.3 % (ref 43.0–77.0)
Platelets: 175 10*3/uL (ref 150.0–400.0)
RBC: 4.36 Mil/uL (ref 4.22–5.81)
WBC: 6.1 10*3/uL (ref 4.5–10.5)

## 2013-07-23 LAB — LIPID PANEL
Cholesterol: 164 mg/dL (ref 0–200)
LDL Cholesterol: 90 mg/dL (ref 0–99)
Triglycerides: 35 mg/dL (ref 0.0–149.0)
VLDL: 7 mg/dL (ref 0.0–40.0)

## 2013-07-23 LAB — HEPATIC FUNCTION PANEL
ALT: 15 U/L (ref 0–53)
AST: 20 U/L (ref 0–37)
Bilirubin, Direct: 0.1 mg/dL (ref 0.0–0.3)
Total Bilirubin: 0.9 mg/dL (ref 0.3–1.2)
Total Protein: 6.4 g/dL (ref 6.0–8.3)

## 2013-07-23 LAB — BASIC METABOLIC PANEL
BUN: 12 mg/dL (ref 6–23)
CO2: 29 mEq/L (ref 19–32)
Chloride: 104 mEq/L (ref 96–112)
Creatinine, Ser: 0.8 mg/dL (ref 0.4–1.5)

## 2013-07-23 LAB — TSH: TSH: 1.86 u[IU]/mL (ref 0.35–5.50)

## 2013-08-12 ENCOUNTER — Ambulatory Visit: Payer: Medicare Other | Admitting: Family Medicine

## 2013-09-14 ENCOUNTER — Telehealth: Payer: Self-pay | Admitting: *Deleted

## 2013-09-14 NOTE — Telephone Encounter (Signed)
Pt called states he received a bill for a knee brace, pt states he was unaware he was to be billed for the brace.  He is requesting a return call from Dr Katrinka Blazing.  Please advise

## 2013-09-15 NOTE — Telephone Encounter (Signed)
Called [employer] back in AM, LVM stating call back. Told about 3rd party billing and likely he had not made his deductible. Told him to call [employer] directly.

## 2013-12-03 ENCOUNTER — Ambulatory Visit (INDEPENDENT_AMBULATORY_CARE_PROVIDER_SITE_OTHER): Payer: Medicare Other

## 2013-12-03 ENCOUNTER — Ambulatory Visit (INDEPENDENT_AMBULATORY_CARE_PROVIDER_SITE_OTHER): Payer: Medicare Other | Admitting: Podiatry

## 2013-12-03 ENCOUNTER — Encounter: Payer: Self-pay | Admitting: Podiatry

## 2013-12-03 VITALS — BP 129/64 | HR 64 | Resp 12

## 2013-12-03 DIAGNOSIS — D492 Neoplasm of unspecified behavior of bone, soft tissue, and skin: Secondary | ICD-10-CM

## 2013-12-03 DIAGNOSIS — M779 Enthesopathy, unspecified: Secondary | ICD-10-CM

## 2013-12-03 DIAGNOSIS — R52 Pain, unspecified: Secondary | ICD-10-CM

## 2013-12-03 MED ORDER — TRIAMCINOLONE ACETONIDE 10 MG/ML IJ SUSP: 10.0000 mg | Freq: Once | INTRAMUSCULAR | Status: DC

## 2013-12-03 NOTE — Progress Notes (Signed)
Subjective:     Patient ID: Austin [name], male   DOB: 04/10/1941, 73 y.o.   MRN: [id number]  Toe Pain    patient states I have a painful knot on the inside of my right big toe right foot that I wanted to see if something can be done for it. I think it's been there for a long time but it's gotten painful over the last month   Review of Systems  All other systems reviewed and are negative.       Objective:   Physical Exam  Nursing note and vitals reviewed. Constitutional: He is oriented to person, place, and time.  Cardiovascular: Intact distal pulses.   Musculoskeletal: Normal range of motion.  Neurological: He is oriented to person, place, and time.  Skin: Skin is warm.   neurovascular status intact with normal muscle strength range of motion and no equinus condition noted. Lateral side of the right hallux there is a small movable area with no skin changes and it is painful when palpated     Assessment:     Possible cyst or inflammatory change or other unknown soft tissue lesion right hallux lateral side    Plan:     H&P and x-ray reviewed. I discussed surgical excision versus trying injection to see if we can get this better. He wants to try injection first and if it does not improve we will consider excision. Today I went ahead and I injected with 1.5 mg dexamethasone 1 mg Kenalog and 3 mg Xylocaine and applied sterile dressing. Reappoint 3 weeks

## 2013-12-03 NOTE — Progress Notes (Signed)
   Subjective:    Patient ID: Austin [name], male    DOB: [birth year], 73 y.o.   MRN: [id number]  HPI  PT. STATED RT FOOT GREAT TOE IS A LITTLE TENDER AND HAVE A KNOT THE INSIDE PART OF THE TOE. NOTICE 6 MONTHS AGO AND IS NOT GETTING BETTER.    Review of Systems  All other systems reviewed and are negative.       Objective:   Physical Exam        Assessment & Plan:

## 2013-12-24 ENCOUNTER — Ambulatory Visit: Payer: Medicare Other | Admitting: Podiatry

## 2014-01-22 ENCOUNTER — Other Ambulatory Visit (INDEPENDENT_AMBULATORY_CARE_PROVIDER_SITE_OTHER): Payer: Medicare Other

## 2014-01-22 DIAGNOSIS — I10 Essential (primary) hypertension: Secondary | ICD-10-CM

## 2014-01-22 DIAGNOSIS — Z0000 Encounter for general adult medical examination without abnormal findings: Secondary | ICD-10-CM

## 2014-01-22 LAB — CBC WITH DIFFERENTIAL/PLATELET
BASOS ABS: 0 10*3/uL (ref 0.0–0.1)
Basophils Relative: 0.5 % (ref 0.0–3.0)
Eosinophils Absolute: 0.3 10*3/uL (ref 0.0–0.7)
Eosinophils Relative: 4.3 % (ref 0.0–5.0)
HEMATOCRIT: 41.5 % (ref 39.0–52.0)
HEMOGLOBIN: 14.1 g/dL (ref 13.0–17.0)
LYMPHS ABS: 1.4 10*3/uL (ref 0.7–4.0)
LYMPHS PCT: 20.8 % (ref 12.0–46.0)
MCHC: 33.9 g/dL (ref 30.0–36.0)
MCV: 93.8 fl (ref 78.0–100.0)
MONO ABS: 0.6 10*3/uL (ref 0.1–1.0)
Monocytes Relative: 8.3 % (ref 3.0–12.0)
NEUTROS ABS: 4.4 10*3/uL (ref 1.4–7.7)
Neutrophils Relative %: 66.1 % (ref 43.0–77.0)
Platelets: 188 10*3/uL (ref 150.0–400.0)
RBC: 4.43 Mil/uL (ref 4.22–5.81)
RDW: 13 % (ref 11.5–14.6)
WBC: 6.7 10*3/uL (ref 4.5–10.5)

## 2014-01-22 LAB — URINALYSIS, ROUTINE W REFLEX MICROSCOPIC
BILIRUBIN URINE: NEGATIVE
Hgb urine dipstick: NEGATIVE
Ketones, ur: NEGATIVE
LEUKOCYTES UA: NEGATIVE
Nitrite: NEGATIVE
SPECIFIC GRAVITY, URINE: 1.015 (ref 1.000–1.030)
Total Protein, Urine: NEGATIVE
UROBILINOGEN UA: 0.2 (ref 0.0–1.0)
Urine Glucose: NEGATIVE
pH: 6 (ref 5.0–8.0)

## 2014-01-22 LAB — LIPID PANEL
CHOLESTEROL: 153 mg/dL (ref 0–200)
HDL: 66 mg/dL (ref >39.00)
LDL Cholesterol: 82 mg/dL (ref 0–99)
Total CHOL/HDL Ratio: 2
Triglycerides: 26 mg/dL (ref 0.0–149.0)
VLDL: 5.2 mg/dL (ref 0.0–40.0)

## 2014-01-22 LAB — PSA: PSA: 2.41 ng/mL (ref 0.10–4.00)

## 2014-01-22 LAB — BASIC METABOLIC PANEL
BUN: 18 mg/dL (ref 6–23)
CHLORIDE: 106 meq/L (ref 96–112)
CO2: 27 mEq/L (ref 19–32)
CREATININE: 0.8 mg/dL (ref 0.4–1.5)
Calcium: 8.9 mg/dL (ref 8.4–10.5)
GFR: 107.64 mL/min (ref >60.00)
Glucose, Bld: 96 mg/dL (ref 70–99)
Potassium: 4 mEq/L (ref 3.5–5.1)
SODIUM: 139 meq/L (ref 135–145)

## 2014-01-22 LAB — TSH: TSH: 2.02 u[IU]/mL (ref 0.35–5.50)

## 2014-01-22 LAB — HEPATIC FUNCTION PANEL
ALK PHOS: 43 U/L (ref 39–117)
ALT: 17 U/L (ref 0–53)
AST: 21 U/L (ref 0–37)
Albumin: 3.8 g/dL (ref 3.5–5.2)
Bilirubin, Direct: 0.1 mg/dL (ref 0.0–0.3)
TOTAL PROTEIN: 6.2 g/dL (ref 6.0–8.3)
Total Bilirubin: 0.7 mg/dL (ref 0.3–1.2)

## 2014-08-31 ENCOUNTER — Encounter: Payer: Self-pay | Admitting: Internal Medicine

## 2014-08-31 ENCOUNTER — Ambulatory Visit (INDEPENDENT_AMBULATORY_CARE_PROVIDER_SITE_OTHER): Payer: Medicare Other | Admitting: Internal Medicine

## 2014-08-31 ENCOUNTER — Other Ambulatory Visit (INDEPENDENT_AMBULATORY_CARE_PROVIDER_SITE_OTHER): Payer: Medicare Other

## 2014-08-31 VITALS — BP 118/82 | HR 61 | Temp 98.0°F | Ht 72.0 in | Wt 187.8 lb

## 2014-08-31 DIAGNOSIS — I10 Essential (primary) hypertension: Secondary | ICD-10-CM

## 2014-08-31 DIAGNOSIS — Z23 Encounter for immunization: Secondary | ICD-10-CM

## 2014-08-31 DIAGNOSIS — Z0000 Encounter for general adult medical examination without abnormal findings: Secondary | ICD-10-CM

## 2014-08-31 LAB — URINALYSIS, ROUTINE W REFLEX MICROSCOPIC
BILIRUBIN URINE: NEGATIVE
Hgb urine dipstick: NEGATIVE
Ketones, ur: NEGATIVE
Leukocytes, UA: NEGATIVE
NITRITE: NEGATIVE
RBC / HPF: NONE SEEN (ref 0–?)
Specific Gravity, Urine: 1.01 (ref 1.000–1.030)
TOTAL PROTEIN, URINE-UPE24: NEGATIVE
UROBILINOGEN UA: 0.2 (ref 0.0–1.0)
Urine Glucose: NEGATIVE
WBC UA: NONE SEEN (ref 0–?)
pH: 6 (ref 5.0–8.0)

## 2014-08-31 LAB — CBC WITH DIFFERENTIAL/PLATELET
BASOS PCT: 0.7 % (ref 0.0–3.0)
Basophils Absolute: 0.1 10*3/uL (ref 0.0–0.1)
Eosinophils Absolute: 0.2 10*3/uL (ref 0.0–0.7)
Eosinophils Relative: 2 % (ref 0.0–5.0)
HCT: 43.6 % (ref 39.0–52.0)
Hemoglobin: 14.4 g/dL (ref 13.0–17.0)
LYMPHS PCT: 16.4 % (ref 12.0–46.0)
Lymphs Abs: 1.3 10*3/uL (ref 0.7–4.0)
MCHC: 33.2 g/dL (ref 30.0–36.0)
MCV: 93 fl (ref 78.0–100.0)
Monocytes Absolute: 0.6 10*3/uL (ref 0.1–1.0)
Monocytes Relative: 8.1 % (ref 3.0–12.0)
Neutro Abs: 5.8 10*3/uL (ref 1.4–7.7)
Neutrophils Relative %: 72.8 % (ref 43.0–77.0)
PLATELETS: 197 10*3/uL (ref 150.0–400.0)
RBC: 4.69 Mil/uL (ref 4.22–5.81)
RDW: 12.9 % (ref 11.5–15.5)
WBC: 7.9 10*3/uL (ref 4.0–10.5)

## 2014-08-31 NOTE — Progress Notes (Signed)
Subjective:    Patient ID: Austin [name], male    DOB: 10-04-1941, 73 y.o.   MRN: [id number]  HPI  Here for wellness and f/u;  Overall doing ok;  Pt denies CP, worsening SOB, DOE, wheezing, orthopnea, PND, worsening LE edema, palpitations, dizziness or syncope.  Pt denies neurological change such as new headache, facial or extremity weakness.  Pt denies polydipsia, polyuria, or low sugar symptoms. Pt states overall good compliance with treatment and medications, good tolerability, and has been trying to follow lower cholesterol diet.  Pt denies worsening depressive symptoms, suicidal ideation or panic. No fever, night sweats, wt loss, loss of appetite, or other constitutional symptoms.  Pt states good ability with ADL's, has low fall risk, home safety reviewed and adequate, no other significant changes in hearing or vision, and only occasionally active with exercise.  No complaints.,  Does have several wks ongoing nasal allergy symptoms with clearish congestion, itch and sneezing, without fever, pain, ST, cough, swelling or wheezing.    Wt Readings from Last 3 Encounters:  08/31/14 187 lb 12 oz (85.163 kg)  07/22/13 189 lb (85.73 kg)  07/22/13 189 lb 4 oz (85.843 kg)   Past Medical History  Diagnosis Date  . Hypertension   . Low back pain   . BPH (benign prostatic hypertrophy)   . Allergy   . History of nephrolithiasis    Past Surgical History  Procedure Laterality Date  . None      reports that he has never smoked. He does not have any smokeless tobacco history on file. He reports that he drinks alcohol. He reports that he does not use illicit drugs. family history includes COPD in his other; Cancer in his other; Diabetes in his mother; Heart disease in his other; Psoriasis in his other. No Known Allergies Current Outpatient Prescriptions on File Prior to Visit  Medication Sig Dispense Refill  . aspirin 81 MG tablet Take 81 mg by mouth daily.      . ramipril (ALTACE) 5 MG capsule  Take 1 capsule (5 mg total) by mouth daily. 90 capsule 3   Current Facility-Administered Medications on File Prior to Visit  Medication Dose Route Frequency Provider Last Rate Last Dose  . triamcinolone acetonide (KENALOG) 10 MG/ML injection 10 mg  10 mg Other Once Wallene Huh, DPM       Review of Systems Constitutional: Negative for increased diaphoresis, other activity, appetite or other siginficant weight change  HENT: Negative for worsening hearing loss, ear pain, facial swelling, mouth sores and neck stiffness.   Eyes: Negative for other worsening pain, redness or visual disturbance.  Respiratory: Negative for shortness of breath and wheezing.   Cardiovascular: Negative for chest pain and palpitations.  Gastrointestinal: Negative for diarrhea, blood in stool, abdominal distention or other pain Genitourinary: Negative for hematuria, flank pain or change in urine volume.  Musculoskeletal: Negative for myalgias or other joint complaints.  Skin: Negative for color change and wound.  Neurological: Negative for syncope and numbness. other than noted Hematological: Negative for adenopathy. or other swelling Psychiatric/Behavioral: Negative for hallucinations, self-injury, decreased concentration or other worsening agitation.  Does have occas muscle cramps, mostly to LE's variosu places and bilat hand often after working in the yard    Objective:   Physical Exam BP 118/82 mmHg  Pulse 61  Temp(Src) 98 F (36.7 C) (Oral)  Ht 6' (1.829 m)  Wt 187 lb 12 oz (85.163 kg)  BMI 25.46 kg/m2  SpO2 97% VS  noted,  Constitutional: Pt is oriented to person, place, and time. Appears well-developed and well-nourished.  Head: Normocephalic and atraumatic.  Right Ear: External ear normal.  Left Ear: External ear normal.  Nose: Nose normal.  Mouth/Throat: Oropharynx is clear and moist.  Eyes: Conjunctivae and EOM are normal. Pupils are equal, round, and reactive to light.  Neck: Normal range of  motion. Neck supple. No JVD present. No tracheal deviation present.  Cardiovascular: Normal rate, regular rhythm, normal heart sounds and intact distal pulses.   Pulmonary/Chest: Effort normal and breath sounds without rales or wheezing  Abdominal: Soft. Bowel sounds are normal. NT. No HSM  Musculoskeletal: Normal range of motion. Exhibits no edema.  Lymphadenopathy:  Has no cervical adenopathy.  Neurological: Pt is alert and oriented to person, place, and time. Pt has normal reflexes. No cranial nerve deficit. Motor grossly intact Skin: Skin is warm and dry. No rash noted.  Psychiatric:  Has normal mood and affect. Behavior is normal.     Assessment & Plan:

## 2014-08-31 NOTE — Patient Instructions (Addendum)
You had the shingles shot today, and the flu shot today  Please continue all other medications as before, and refills have been done if requested.  Please have the pharmacy call with any other refills you may need.  Please continue your efforts at being more active, low cholesterol diet, and weight control.  You are otherwise up to date with prevention measures today.  Please keep your appointments with your specialists as you may have planned  Please go to the LAB in the Basement (turn left off the elevator) for the tests to be done today  You will be contacted by phone if any changes need to be made immediately.  Otherwise, you will receive a letter about your results with an explanation, but please check with MyChart first.  Please remember to sign up for MyChart if you have not done so, as this will be important to you in the future with finding out test results, communicating by private email, and scheduling acute appointments online when needed.  Please return in 1 year for your yearly visit, or sooner if needed, with Lab testing done 3-5 days before

## 2014-08-31 NOTE — Progress Notes (Signed)
Pre visit review using our clinic review tool, if applicable. No additional management support is needed unless otherwise documented below in the visit note. 

## 2014-08-31 NOTE — Assessment & Plan Note (Signed)

## 2014-08-31 NOTE — Addendum Note (Signed)
Addended by: Sharon Seller B on: 08/31/2014 05:31 PM   Modules accepted: Orders

## 2014-09-01 ENCOUNTER — Other Ambulatory Visit: Payer: Self-pay | Admitting: Internal Medicine

## 2014-09-01 DIAGNOSIS — R972 Elevated prostate specific antigen [PSA]: Secondary | ICD-10-CM

## 2014-09-01 LAB — LIPID PANEL
Cholesterol: 185 mg/dL (ref 0–200)
HDL: 70.4 mg/dL (ref >39.00)
LDL Cholesterol: 103 mg/dL — ABNORMAL HIGH (ref 0–99)
NonHDL: 114.6
TRIGLYCERIDES: 56 mg/dL (ref 0.0–149.0)
Total CHOL/HDL Ratio: 3
VLDL: 11.2 mg/dL (ref 0.0–40.0)

## 2014-09-01 LAB — BASIC METABOLIC PANEL
BUN: 11 mg/dL (ref 6–23)
CHLORIDE: 104 meq/L (ref 96–112)
CO2: 28 mEq/L (ref 19–32)
CREATININE: 0.9 mg/dL (ref 0.4–1.5)
Calcium: 9.5 mg/dL (ref 8.4–10.5)
GFR: 86.19 mL/min (ref >60.00)
Glucose, Bld: 101 mg/dL — ABNORMAL HIGH (ref 70–99)
POTASSIUM: 5.1 meq/L (ref 3.5–5.1)
Sodium: 141 mEq/L (ref 135–145)

## 2014-09-01 LAB — HEPATIC FUNCTION PANEL
ALT: 18 U/L (ref 0–53)
AST: 20 U/L (ref 0–37)
Albumin: 4.5 g/dL (ref 3.5–5.2)
Alkaline Phosphatase: 54 U/L (ref 39–117)
Bilirubin, Direct: 0.1 mg/dL (ref 0.0–0.3)
TOTAL PROTEIN: 6.9 g/dL (ref 6.0–8.3)
Total Bilirubin: 0.7 mg/dL (ref 0.2–1.2)

## 2014-09-01 LAB — PSA: PSA: 3.2 ng/mL (ref 0.10–4.00)

## 2014-09-01 LAB — TSH: TSH: 1.55 u[IU]/mL (ref 0.35–4.50)

## 2014-09-07 ENCOUNTER — Other Ambulatory Visit: Payer: Self-pay

## 2014-09-07 ENCOUNTER — Telehealth: Payer: Self-pay | Admitting: Internal Medicine

## 2014-09-07 MED ORDER — RAMIPRIL 5 MG PO CAPS: 5.0000 mg | ORAL_CAPSULE | Freq: Every day | ORAL | Status: DC

## 2014-09-07 NOTE — Telephone Encounter (Signed)
Pt states his BP needs to go through [facility] not [facility], ramipril (ALTACE) 5 MG capsule.

## 2014-09-07 NOTE — Telephone Encounter (Signed)
Done

## 2014-10-18 ENCOUNTER — Encounter: Payer: Self-pay | Admitting: Internal Medicine

## 2014-10-19 NOTE — Telephone Encounter (Signed)
Austin [name]  - to see above please

## 2014-10-26 ENCOUNTER — Other Ambulatory Visit: Payer: Self-pay | Admitting: *Deleted

## 2014-10-26 MED ORDER — RAMIPRIL 5 MG PO CAPS: 5.0000 mg | ORAL_CAPSULE | Freq: Every day | ORAL | Status: DC

## 2014-12-31 ENCOUNTER — Encounter: Payer: Self-pay | Admitting: Internal Medicine

## 2014-12-31 ENCOUNTER — Ambulatory Visit (INDEPENDENT_AMBULATORY_CARE_PROVIDER_SITE_OTHER): Payer: Medicare Other | Admitting: Internal Medicine

## 2014-12-31 VITALS — BP 126/80 | HR 66 | Temp 98.0°F | Resp 18 | Ht 72.0 in | Wt 192.1 lb

## 2014-12-31 DIAGNOSIS — I10 Essential (primary) hypertension: Secondary | ICD-10-CM | POA: Diagnosis not present

## 2014-12-31 DIAGNOSIS — L989 Disorder of the skin and subcutaneous tissue, unspecified: Secondary | ICD-10-CM | POA: Insufficient documentation

## 2014-12-31 MED ORDER — TRIAMCINOLONE ACETONIDE 0.1 % EX CREA: 1.0000 "application " | TOPICAL_CREAM | Freq: Two times a day (BID) | CUTANEOUS | Status: DC

## 2014-12-31 NOTE — Patient Instructions (Addendum)
Please take all new medication as prescribed - the cream ([facility])  Please continue all other medications as before, and refills have been done if requested.  Please have the pharmacy call with any other refills you may need.  Please keep your appointments with your specialists as you may have planned  You will be contacted regarding the referral for:   [facility] Address: [address] [place], [place] [address] [phone]

## 2014-12-31 NOTE — Assessment & Plan Note (Signed)
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

## 2014-12-31 NOTE — Progress Notes (Signed)
   Subjective:    Patient ID: Austin [name], male    DOB: 06/16/1941, 74 y.o.   MRN: [id number]  HPI  Here after noticed a lump near crown of head a few days ago, somewhat tender, somewhat itchy at times, a snes of sorness/headache, no drainage or changing size but may have been more prominent recent..  No recent trauma, does hit head getting in the truck occas, not recently. Nohthing makes worse or better.  Also with a lump chronic to right mid arm but no change and present for year.  Pt denies chest pain, increased sob or doe, wheezing, orthopnea, PND, increased LE swelling, palpitations, dizziness or syncope. No blood thinners. Past Medical History  Diagnosis Date  . Hypertension   . Low back pain   . BPH (benign prostatic hypertrophy)   . Allergy   . History of nephrolithiasis    Past Surgical History  Procedure Laterality Date  . None      reports that he has never smoked. He does not have any smokeless tobacco history on file. He reports that he drinks alcohol. He reports that he does not use illicit drugs. family history includes COPD in his other; Cancer in his other; Diabetes in his mother; Heart disease in his other; Psoriasis in his other. No Known Allergies Current Outpatient Prescriptions on File Prior to Visit  Medication Sig Dispense Refill  . aspirin 81 MG tablet Take 81 mg by mouth daily.      . ramipril (ALTACE) 5 MG capsule Take 1 capsule (5 mg total) by mouth daily. 90 capsule 3   Current Facility-Administered Medications on File Prior to Visit  Medication Dose Route Frequency Provider Last Rate Last Dose  . triamcinolone acetonide (KENALOG) 10 MG/ML injection 10 mg  10 mg Other Once Wallene Huh, DPM       Review of Systems  All otherwise neg per pt     Objective:   Physical Exam BP 126/80 mmHg  Pulse 66  Temp(Src) 98 F (36.7 C) (Oral)  Resp 18  Ht 6' (1.829 m)  Wt 192 lb 1.3 oz (87.127 kg)  BMI 26.05 kg/m2  SpO2 96% VS noted,  Constitutional: Pt  appears in no significant distress HENT: Head: NCAT.  Right Ear: External ear normal.  Left Ear: External ear normal.  Eyes: . Pupils are equal, round, and reactive to light. Conjunctivae and EOM are normal Neck: Normal range of motion. Neck supple.  Cardiovascular: Normal rate and regular rhythm.   Pulmonary/Chest: Effort normal and breath sounds without rales or wheezing.  Abd:  Soft, NT, ND, + BS Neurological: Pt is alert. Not confused , motor grossly intact Skin: Skin is warm. No rash, no LE edema; `at 1-2 cm left lateral and forward of the crown is a raised slightly tender slightly raised 2 mm firm nodular lesion approx 1.5 cm x .5 cm  with erythem rash like appearance overlying, without frank ulceration, fluctuance or drainage  Psychiatric: Pt behavior is normal. No agitation.     Assessment & Plan:

## 2014-12-31 NOTE — Progress Notes (Signed)
Pre visit review using our clinic review tool, if applicable. No additional management support is needed unless otherwise documented below in the visit note. 

## 2014-12-31 NOTE — Assessment & Plan Note (Signed)
stable overall by history and exam, recent data reviewed with pt, and pt to continue medical treatment as before,  to f/u any worsening symptoms or concerns BP Readings from Last 3 Encounters:  12/31/14 126/80  08/31/14 118/82  12/03/13 129/64

## 2015-01-03 ENCOUNTER — Encounter: Payer: Self-pay | Admitting: Internal Medicine

## 2015-01-13 ENCOUNTER — Encounter: Payer: Self-pay | Admitting: Internal Medicine

## 2015-04-04 DIAGNOSIS — N401 Enlarged prostate with lower urinary tract symptoms: Secondary | ICD-10-CM | POA: Diagnosis not present

## 2015-04-08 DIAGNOSIS — N401 Enlarged prostate with lower urinary tract symptoms: Secondary | ICD-10-CM | POA: Diagnosis not present

## 2015-04-08 DIAGNOSIS — R972 Elevated prostate specific antigen [PSA]: Secondary | ICD-10-CM | POA: Diagnosis not present

## 2015-04-08 DIAGNOSIS — R3912 Poor urinary stream: Secondary | ICD-10-CM | POA: Diagnosis not present

## 2015-08-17 DIAGNOSIS — H43813 Vitreous degeneration, bilateral: Secondary | ICD-10-CM | POA: Diagnosis not present

## 2015-12-08 ENCOUNTER — Encounter: Payer: Self-pay | Admitting: Internal Medicine

## 2015-12-08 ENCOUNTER — Ambulatory Visit (INDEPENDENT_AMBULATORY_CARE_PROVIDER_SITE_OTHER): Payer: Medicare Other | Admitting: Internal Medicine

## 2015-12-08 ENCOUNTER — Other Ambulatory Visit (INDEPENDENT_AMBULATORY_CARE_PROVIDER_SITE_OTHER): Payer: Medicare Other

## 2015-12-08 VITALS — BP 132/80 | HR 63 | Temp 98.0°F | Resp 20 | Wt 188.0 lb

## 2015-12-08 DIAGNOSIS — I10 Essential (primary) hypertension: Secondary | ICD-10-CM

## 2015-12-08 DIAGNOSIS — Z0000 Encounter for general adult medical examination without abnormal findings: Secondary | ICD-10-CM | POA: Diagnosis not present

## 2015-12-08 DIAGNOSIS — E785 Hyperlipidemia, unspecified: Secondary | ICD-10-CM

## 2015-12-08 LAB — HEPATIC FUNCTION PANEL
ALK PHOS: 54 U/L (ref 39–117)
ALT: 10 U/L (ref 0–53)
AST: 15 U/L (ref 0–37)
Albumin: 4.3 g/dL (ref 3.5–5.2)
BILIRUBIN DIRECT: 0.2 mg/dL (ref 0.0–0.3)
BILIRUBIN TOTAL: 0.8 mg/dL (ref 0.2–1.2)
TOTAL PROTEIN: 6.7 g/dL (ref 6.0–8.3)

## 2015-12-08 LAB — LIPID PANEL
CHOL/HDL RATIO: 3
Cholesterol: 146 mg/dL (ref 0–200)
HDL: 55.7 mg/dL (ref >39.00)
LDL CALC: 78 mg/dL (ref 0–99)
NonHDL: 90.41
Triglycerides: 62 mg/dL (ref 0.0–149.0)
VLDL: 12.4 mg/dL (ref 0.0–40.0)

## 2015-12-08 LAB — BASIC METABOLIC PANEL
BUN: 13 mg/dL (ref 6–23)
CALCIUM: 9.4 mg/dL (ref 8.4–10.5)
CO2: 30 mEq/L (ref 19–32)
Chloride: 106 mEq/L (ref 96–112)
Creatinine, Ser: 0.82 mg/dL (ref 0.40–1.50)
GFR: 98.08 mL/min (ref >60.00)
Glucose, Bld: 97 mg/dL (ref 70–99)
POTASSIUM: 5 meq/L (ref 3.5–5.1)
SODIUM: 141 meq/L (ref 135–145)

## 2015-12-08 LAB — CBC WITH DIFFERENTIAL/PLATELET
BASOS ABS: 0 10*3/uL (ref 0.0–0.1)
BASOS PCT: 0.7 % (ref 0.0–3.0)
Eosinophils Absolute: 0.2 10*3/uL (ref 0.0–0.7)
Eosinophils Relative: 2.8 % (ref 0.0–5.0)
HEMATOCRIT: 40.1 % (ref 39.0–52.0)
Hemoglobin: 13.5 g/dL (ref 13.0–17.0)
LYMPHS ABS: 1.2 10*3/uL (ref 0.7–4.0)
Lymphocytes Relative: 18.8 % (ref 12.0–46.0)
MCHC: 33.7 g/dL (ref 30.0–36.0)
MCV: 89.7 fl (ref 78.0–100.0)
MONOS PCT: 8.1 % (ref 3.0–12.0)
Monocytes Absolute: 0.5 10*3/uL (ref 0.1–1.0)
NEUTROS ABS: 4.4 10*3/uL (ref 1.4–7.7)
NEUTROS PCT: 69.6 % (ref 43.0–77.0)
PLATELETS: 226 10*3/uL (ref 150.0–400.0)
RBC: 4.47 Mil/uL (ref 4.22–5.81)
RDW: 13.2 % (ref 11.5–15.5)
WBC: 6.4 10*3/uL (ref 4.0–10.5)

## 2015-12-08 LAB — URINALYSIS, ROUTINE W REFLEX MICROSCOPIC
Bilirubin Urine: NEGATIVE
Hgb urine dipstick: NEGATIVE
Ketones, ur: NEGATIVE
LEUKOCYTES UA: NEGATIVE
Nitrite: NEGATIVE
PH: 6 (ref 5.0–8.0)
RBC / HPF: NONE SEEN (ref 0–?)
TOTAL PROTEIN, URINE-UPE24: NEGATIVE
URINE GLUCOSE: NEGATIVE
Urobilinogen, UA: 0.2 (ref 0.0–1.0)

## 2015-12-08 LAB — PSA: PSA: 2.75 ng/mL (ref 0.10–4.00)

## 2015-12-08 LAB — TSH: TSH: 1.24 u[IU]/mL (ref 0.35–4.50)

## 2015-12-08 MED ORDER — RAMIPRIL 5 MG PO CAPS: 5.0000 mg | ORAL_CAPSULE | Freq: Every day | ORAL | Status: DC

## 2015-12-08 NOTE — Assessment & Plan Note (Signed)
stable overall by history and exam, recent data reviewed with pt, and pt to continue medical treatment as before,  to f/u any worsening symptoms or concerns BP Readings from Last 3 Encounters:  12/08/15 132/80  12/31/14 126/80  08/31/14 118/82

## 2015-12-08 NOTE — Patient Instructions (Addendum)

## 2015-12-08 NOTE — Progress Notes (Signed)
Pre visit review using our clinic review tool, if applicable. No additional management support is needed unless otherwise documented below in the visit note. 

## 2015-12-08 NOTE — Progress Notes (Signed)
Subjective:    Patient ID: Austin [name], male    DOB: 1941/10/05, 75 y.o.   MRN: [id number]  HPI  Here for wellness and f/u;  Overall doing ok;  Pt denies Chest pain, worsening SOB, DOE, wheezing, orthopnea, PND, worsening LE edema, palpitations, dizziness or syncope.  Pt denies neurological change such as new headache, facial or extremity weakness.  Pt denies polydipsia, polyuria, or low sugar symptoms. Pt states overall good compliance with treatment and medications, good tolerability, and has been trying to follow appropriate diet.  Pt denies worsening depressive symptoms, suicidal ideation or panic. No fever, night sweats, wt loss, loss of appetite, or other constitutional symptoms.  Pt states good ability with ADL's, has low fall risk, home safety reviewed and adequate, no other significant changes in hearing or vision, and only occasionally active with exercise. Did see Dr Nesi/urology with f/u psa after initial rise, later improved.  Pt denies fever, wt loss, night sweats, loss of appetite, or other constitutional symptoms No current compliants Past Medical History  Diagnosis Date  . Hypertension   . Low back pain   . BPH (benign prostatic hypertrophy)   . Allergy   . History of nephrolithiasis    Past Surgical History  Procedure Laterality Date  . None      reports that he has never smoked. He does not have any smokeless tobacco history on file. He reports that he drinks alcohol. He reports that he does not use illicit drugs. family history includes COPD in his other; Cancer in his other; Diabetes in his mother; Heart disease in his other; Psoriasis in his other. No Known Allergies  Current Outpatient Prescriptions on File Prior to Visit  Medication Sig Dispense Refill  . aspirin 81 MG tablet Take 81 mg by mouth daily.      . ramipril (ALTACE) 5 MG capsule Take 1 capsule (5 mg total) by mouth daily. 90 capsule 3   No current facility-administered medications on file prior to  visit.     Review of Systems Constitutional: Negative for increased diaphoresis, other activity, appetite or siginficant weight change other than noted HENT: Negative for worsening hearing loss, ear pain, facial swelling, mouth sores and neck stiffness.   Eyes: Negative for other worsening pain, redness or visual disturbance.  Respiratory: Negative for shortness of breath and wheezing  Cardiovascular: Negative for chest pain and palpitations.  Gastrointestinal: Negative for diarrhea, blood in stool, abdominal distention or other pain Genitourinary: Negative for hematuria, flank pain or change in urine volume.  Musculoskeletal: Negative for myalgias or other joint complaints.  Skin: Negative for color change and wound or drainage.  Neurological: Negative for syncope and numbness. other than noted Hematological: Negative for adenopathy. or other swelling Psychiatric/Behavioral: Negative for hallucinations, SI, self-injury, decreased concentration or other worsening agitation. ]    Objective:   Physical Exam BP 132/80 mmHg  Pulse 63  Temp(Src) 98 F (36.7 C) (Oral)  Resp 20  Wt 188 lb (85.276 kg)  SpO2 98% VS noted,  Constitutional: Pt is oriented to person, place, and time. Appears well-developed and well-nourished, in no significant distress Head: Normocephalic and atraumatic.  Right Ear: External ear normal.  Left Ear: External ear normal.  Nose: Nose normal.  Mouth/Throat: Oropharynx is clear and moist.  Eyes: Conjunctivae and EOM are normal. Pupils are equal, round, and reactive to light.  Neck: Normal range of motion. Neck supple. No JVD present. No tracheal deviation present or significant neck LA or mass  Cardiovascular: Normal rate, regular rhythm, normal heart sounds and intact distal pulses.   Pulmonary/Chest: Effort normal and breath sounds without rales or wheezing  Abdominal: Soft. Bowel sounds are normal. NT. No HSM  Musculoskeletal: Normal range of motion. Exhibits  no edema.  Lymphadenopathy:  Has no cervical adenopathy.  Neurological: Pt is alert and oriented to person, place, and time. Pt has normal reflexes. No cranial nerve deficit. Motor grossly intact Skin: Skin is warm and dry. No rash noted.  Psychiatric:  Has normal mood and affect. Behavior is normal.      Assessment & Plan:

## 2015-12-09 ENCOUNTER — Encounter: Payer: Self-pay | Admitting: Internal Medicine

## 2015-12-09 NOTE — Assessment & Plan Note (Signed)

## 2015-12-12 ENCOUNTER — Other Ambulatory Visit: Payer: Self-pay | Admitting: Internal Medicine

## 2015-12-12 ENCOUNTER — Encounter: Payer: Self-pay | Admitting: Internal Medicine

## 2015-12-13 ENCOUNTER — Telehealth: Payer: Self-pay | Admitting: *Deleted

## 2015-12-13 MED ORDER — RAMIPRIL 5 MG PO CAPS: ORAL_CAPSULE | ORAL | Status: DC

## 2015-12-13 NOTE — Telephone Encounter (Signed)
Received call pt states their was a mix up on his medicine. Told nurse he uses [facility] Needing rx sent to [facility] order. Inform pt will update and send to [facility]. [facility] that was [facility] yesterday...Austin [name]

## 2016-01-19 ENCOUNTER — Ambulatory Visit (AMBULATORY_SURGERY_CENTER): Payer: Self-pay

## 2016-01-19 VITALS — Ht 72.0 in | Wt 188.0 lb

## 2016-01-19 DIAGNOSIS — Z1211 Encounter for screening for malignant neoplasm of colon: Secondary | ICD-10-CM

## 2016-01-19 MED ORDER — SUPREP BOWEL PREP KIT 17.5-3.13-1.6 GM/177ML PO SOLN: 1.0000 | Freq: Once | ORAL | Status: DC

## 2016-01-19 NOTE — Progress Notes (Signed)
No allergies to eggs or soy No home oxygen No diet/weight loss meds No past problems with anesthesia  Has email and internet; refused emmi

## 2016-02-02 ENCOUNTER — Encounter: Payer: Self-pay | Admitting: Internal Medicine

## 2016-02-02 ENCOUNTER — Ambulatory Visit (AMBULATORY_SURGERY_CENTER): Payer: Medicare Other | Admitting: Internal Medicine

## 2016-02-02 VITALS — BP 114/63 | HR 62 | Temp 98.0°F | Resp 10 | Ht 72.0 in | Wt 188.0 lb

## 2016-02-02 DIAGNOSIS — D120 Benign neoplasm of cecum: Secondary | ICD-10-CM | POA: Diagnosis not present

## 2016-02-02 DIAGNOSIS — Z1211 Encounter for screening for malignant neoplasm of colon: Secondary | ICD-10-CM | POA: Diagnosis present

## 2016-02-02 DIAGNOSIS — D124 Benign neoplasm of descending colon: Secondary | ICD-10-CM | POA: Diagnosis not present

## 2016-02-02 DIAGNOSIS — K621 Rectal polyp: Secondary | ICD-10-CM

## 2016-02-02 DIAGNOSIS — D128 Benign neoplasm of rectum: Secondary | ICD-10-CM

## 2016-02-02 MED ORDER — SODIUM CHLORIDE 0.9 % IV SOLN: 500.0000 mL | INTRAVENOUS | Status: DC

## 2016-02-02 NOTE — Patient Instructions (Signed)
Colon polyps removed today. Diverticulosis and hemorrhoids seen. Handouts given on polyps,diverticulosis and hemorrhoids. Result letter in your mail in 2-3 weeks. Resume current medications.  [place] with any questions or concerns. Thank you!   YOU HAD AN ENDOSCOPIC PROCEDURE TODAY AT [facility]:   Refer to the procedure report that was given to you for any specific questions about what was found during the examination.  If the procedure report does not answer your questions, please call your gastroenterologist to clarify.  If you requested that your care partner not be given the details of your procedure findings, then the procedure report has been included in a sealed envelope for you to review at your convenience later.  YOU SHOULD EXPECT: Some feelings of bloating in the abdomen. Passage of more gas than usual.  Walking can help get rid of the air that was put into your GI tract during the procedure and reduce the bloating. If you had a lower endoscopy (such as a colonoscopy or flexible sigmoidoscopy) you may notice spotting of blood in your stool or on the toilet paper. If you underwent a bowel prep for your procedure, you may not have a normal bowel movement for a few days.  Please Note:  You might notice some irritation and congestion in your nose or some drainage.  This is from the oxygen used during your procedure.  There is no need for concern and it should clear up in a day or so.  SYMPTOMS TO REPORT IMMEDIATELY:   Following lower endoscopy (colonoscopy or flexible sigmoidoscopy):  Excessive amounts of blood in the stool  Significant tenderness or worsening of abdominal pains  Swelling of the abdomen that is new, acute  Fever of 100F or higher   For urgent or emergent issues, a gastroenterologist can be reached at any hour by calling [phone].   DIET: Your first meal following the procedure should be a small meal and then it is ok to progress to your normal  diet. Heavy or fried foods are harder to digest and may make you feel nauseous or bloated.  Likewise, meals heavy in dairy and vegetables can increase bloating.  Drink plenty of fluids but you should avoid alcoholic beverages for 24 hours.  ACTIVITY:  You should plan to take it easy for the rest of today and you should NOT DRIVE or use heavy machinery until tomorrow (because of the sedation medicines used during the test).    FOLLOW UP: Our staff will call the number listed on your records the next business day following your procedure to check on you and address any questions or concerns that you may have regarding the information given to you following your procedure. If we do not reach you, we will leave a message.  However, if you are feeling well and you are not experiencing any problems, there is no need to return our call.  We will assume that you have returned to your regular daily activities without incident.  If any biopsies were taken you will be contacted by phone or by letter within the next 1-3 weeks.  Please call us at [phone] if you have not heard about the biopsies in 3 weeks.    SIGNATURES/CONFIDENTIALITY: You and/or your care partner have signed paperwork which will be entered into your electronic medical record.  These signatures attest to the fact that that the information above on your After Visit Summary has been reviewed and is understood.  Full responsibility of  the confidentiality of this discharge information lies with you and/or your care-partner. 

## 2016-02-02 NOTE — Progress Notes (Signed)
A/ox3 pleased with MAC, report to Robbin RN 

## 2016-02-02 NOTE — Progress Notes (Signed)
Called to room to assist during endoscopic procedure.  Patient ID and intended procedure confirmed with present staff. Received instructions for my participation in the procedure from the performing physician.  

## 2016-02-02 NOTE — Op Note (Signed)
[facility] Patient Name: Austin [name] Procedure Date: 02/02/2016 9:50 AM MRN: [id number] Endoscopist: Jerene Bears , MD Age: 75 Date of Birth: Oct 11, 1941 Gender: Male Procedure:                Colonoscopy Indications:              Screening for colorectal malignant neoplasm (last                            colonoscopy was more than 10 years ago in 2004 with                            Dr. Carlean Purl) Medicines:                Monitored Anesthesia Care Procedure:                Pre-Anesthesia Assessment:                           - Prior to the procedure, a History and Physical                            was performed, and patient medications and                            allergies were reviewed. The patient's tolerance of                            previous anesthesia was also reviewed. The risks                            and benefits of the procedure and the sedation                            options and risks were discussed with the patient.                            All questions were answered, and informed consent                            was obtained. Prior Anticoagulants: The patient has                            taken no previous anticoagulant or antiplatelet                            agents. ASA Grade Assessment: II - A patient with                            mild systemic disease. After reviewing the risks                            and benefits, the patient was deemed in  satisfactory condition to undergo the procedure.                           After obtaining informed consent, the colonoscope                            was passed under direct vision. Throughout the                            procedure, the patient's blood pressure, pulse, and                            oxygen saturations were monitored continuously. The                            Model CF-HQ190L [id number]) scope was introduced                            through  the anus and advanced to the the cecum,                            identified by appendiceal orifice and ileocecal                            valve. The colonoscopy was performed without                            difficulty. The patient tolerated the procedure                            well. The quality of the bowel preparation was                            good. The ileocecal valve, appendiceal orifice, and                            rectum were photographed. Scope In: 10:00:16 AM Scope Out: 10:16:16 AM Scope Withdrawal Time: 0 hours 12 minutes 37 seconds  Total Procedure Duration: 0 hours 16 minutes 0 seconds  Findings:                 The digital rectal exam was normal.                           A 12 mm polyp was found in the cecum. The polyp was                            sessile. The polyp was removed with a cold snare.                            Resection and retrieval were complete.                           A 4 mm polyp was found in the proximal descending  colon. The polyp was sessile. The polyp was removed                            with a cold snare. Resection and retrieval were                            complete.                           A 6 mm polyp was found in the rectum. The polyp was                            sessile. The polyp was removed with a cold snare.                            Resection and retrieval were complete.                           Multiple small-mouthed diverticula were found in                            the sigmoid colon.                           Internal hemorrhoids were found during                            retroflexion. The hemorrhoids were small. Complications:            No immediate complications. Estimated Blood Loss:     Estimated blood loss was minimal. Impression:               - One 12 mm polyp in the cecum, removed with a cold                            snare. Resected and retrieved.                            - One 4 mm polyp in the proximal descending colon,                            removed with a cold snare. Resected and retrieved.                           - One 6 mm polyp in the rectum, removed with a cold                            snare. Resected and retrieved.                           - Diverticulosis in the sigmoid colon.                           - Internal hemorrhoids. Recommendation:           -  Patient has a contact number available for                            emergencies. The signs and symptoms of potential                            delayed complications were discussed with the                            patient. Return to normal activities tomorrow.                            Written discharge instructions were provided to the                            patient.                           - Resume previous diet.                           - Continue present medications.                           - Await pathology results.                           - Repeat colonoscopy is recommended for                            surveillance. The colonoscopy date will be                            determined after pathology results from today's                            exam become available for review. Jerene Bears, MD 02/02/2016 10:21:02 AM This report has been signed electronically.

## 2016-02-03 ENCOUNTER — Telehealth: Payer: Self-pay

## 2016-02-03 NOTE — Telephone Encounter (Signed)
  Follow up Call-  Call back number 02/02/2016  Post procedure Call Back phone  # [phone] hm  Permission to leave phone message Yes     Patient questions:  Do you have a fever, pain , or abdominal swelling? No. Pain Score  0 *  Have you tolerated food without any problems? Yes.    Have you been able to return to your normal activities? Yes.    Do you have any questions about your discharge instructions: Diet   No. Medications  No. Follow up visit  No.  Do you have questions or concerns about your Care? No.  Actions: * If pain score is 4 or above: No action needed, pain <4.

## 2016-02-09 ENCOUNTER — Encounter: Payer: Self-pay | Admitting: Internal Medicine

## 2016-10-01 ENCOUNTER — Other Ambulatory Visit: Payer: Self-pay | Admitting: Internal Medicine

## 2016-12-11 ENCOUNTER — Encounter: Payer: Self-pay | Admitting: Internal Medicine

## 2016-12-11 MED ORDER — RAMIPRIL 5 MG PO CAPS: 5.0000 mg | ORAL_CAPSULE | Freq: Every day | ORAL | 0 refills | Status: DC

## 2016-12-25 ENCOUNTER — Other Ambulatory Visit (INDEPENDENT_AMBULATORY_CARE_PROVIDER_SITE_OTHER): Payer: Medicare Other

## 2016-12-25 DIAGNOSIS — Z0000 Encounter for general adult medical examination without abnormal findings: Secondary | ICD-10-CM

## 2016-12-25 LAB — BASIC METABOLIC PANEL
BUN: 17 mg/dL (ref 6–23)
CHLORIDE: 104 meq/L (ref 96–112)
CO2: 28 mEq/L (ref 19–32)
Calcium: 9.5 mg/dL (ref 8.4–10.5)
Creatinine, Ser: 0.84 mg/dL (ref 0.40–1.50)
GFR: 95.11 mL/min (ref >60.00)
GLUCOSE: 98 mg/dL (ref 70–99)
POTASSIUM: 4.4 meq/L (ref 3.5–5.1)
Sodium: 139 mEq/L (ref 135–145)

## 2016-12-25 LAB — CBC WITH DIFFERENTIAL/PLATELET
BASOS PCT: 0.8 % (ref 0.0–3.0)
Basophils Absolute: 0.1 10*3/uL (ref 0.0–0.1)
EOS ABS: 0.2 10*3/uL (ref 0.0–0.7)
Eosinophils Relative: 2.8 % (ref 0.0–5.0)
HCT: 41.6 % (ref 39.0–52.0)
HEMOGLOBIN: 13.9 g/dL (ref 13.0–17.0)
LYMPHS ABS: 1.3 10*3/uL (ref 0.7–4.0)
Lymphocytes Relative: 19.3 % (ref 12.0–46.0)
MCHC: 33.4 g/dL (ref 30.0–36.0)
MCV: 90 fl (ref 78.0–100.0)
MONO ABS: 0.7 10*3/uL (ref 0.1–1.0)
Monocytes Relative: 9.8 % (ref 3.0–12.0)
NEUTROS PCT: 67.3 % (ref 43.0–77.0)
Neutro Abs: 4.6 10*3/uL (ref 1.4–7.7)
Platelets: 188 10*3/uL (ref 150.0–400.0)
RBC: 4.62 Mil/uL (ref 4.22–5.81)
RDW: 13.1 % (ref 11.5–15.5)
WBC: 6.8 10*3/uL (ref 4.0–10.5)

## 2016-12-25 LAB — HEPATIC FUNCTION PANEL
ALBUMIN: 4.5 g/dL (ref 3.5–5.2)
ALT: 12 U/L (ref 0–53)
AST: 16 U/L (ref 0–37)
Alkaline Phosphatase: 56 U/L (ref 39–117)
Bilirubin, Direct: 0.1 mg/dL (ref 0.0–0.3)
Total Bilirubin: 0.6 mg/dL (ref 0.2–1.2)
Total Protein: 7 g/dL (ref 6.0–8.3)

## 2016-12-25 LAB — PSA: PSA: 4.37 ng/mL — AB (ref 0.10–4.00)

## 2016-12-25 LAB — URINALYSIS, ROUTINE W REFLEX MICROSCOPIC
Bilirubin Urine: NEGATIVE
Hgb urine dipstick: NEGATIVE
KETONES UR: NEGATIVE
Leukocytes, UA: NEGATIVE
NITRITE: NEGATIVE
RBC / HPF: NONE SEEN (ref 0–?)
SPECIFIC GRAVITY, URINE: 1.01 (ref 1.000–1.030)
TOTAL PROTEIN, URINE-UPE24: NEGATIVE
URINE GLUCOSE: NEGATIVE
Urobilinogen, UA: 0.2 (ref 0.0–1.0)
WBC, UA: NONE SEEN (ref 0–?)
pH: 6 (ref 5.0–8.0)

## 2016-12-25 LAB — LIPID PANEL
Cholesterol: 142 mg/dL (ref 0–200)
HDL: 53.7 mg/dL (ref >39.00)
LDL Cholesterol: 75 mg/dL (ref 0–99)
NonHDL: 88.79
TRIGLYCERIDES: 68 mg/dL (ref 0.0–149.0)
Total CHOL/HDL Ratio: 3
VLDL: 13.6 mg/dL (ref 0.0–40.0)

## 2016-12-25 LAB — TSH: TSH: 2.15 u[IU]/mL (ref 0.35–4.50)

## 2017-01-02 ENCOUNTER — Ambulatory Visit (INDEPENDENT_AMBULATORY_CARE_PROVIDER_SITE_OTHER): Payer: Medicare Other | Admitting: Internal Medicine

## 2017-01-02 ENCOUNTER — Encounter: Payer: Self-pay | Admitting: Internal Medicine

## 2017-01-02 VITALS — BP 126/80 | HR 72 | Temp 98.2°F | Ht 72.0 in | Wt 191.0 lb

## 2017-01-02 DIAGNOSIS — Z0000 Encounter for general adult medical examination without abnormal findings: Secondary | ICD-10-CM | POA: Diagnosis not present

## 2017-01-02 DIAGNOSIS — R972 Elevated prostate specific antigen [PSA]: Secondary | ICD-10-CM | POA: Diagnosis not present

## 2017-01-02 MED ORDER — DOXYCYCLINE HYCLATE 100 MG PO TABS: 100.0000 mg | ORAL_TABLET | Freq: Two times a day (BID) | ORAL | 0 refills | Status: DC

## 2017-01-02 MED ORDER — RAMIPRIL 5 MG PO CAPS: 5.0000 mg | ORAL_CAPSULE | Freq: Every day | ORAL | 3 refills | Status: DC

## 2017-01-02 NOTE — Patient Instructions (Addendum)
Please take all new medication as prescribed - the antibiotic  Please continue all other medications as before, and refills have been done if requested.  Please have the pharmacy call with any other refills you may need.  Please continue your efforts at being more active, low cholesterol diet, and weight control.  You are otherwise up to date with prevention measures today.  Please keep your appointments with your specialists as you may have planned  Please return for LAB only in 4 weeks for a repeat PSA (you most likely will not have any reminders to do this)  If this is improved, and you are interested, we can arrange for another to be done at 6 months, instead of waiting to next visit  You will be contacted by phone if any changes need to be made immediately.  Otherwise, you will receive a letter about your results with an explanation, but please check with MyChart first.  Please remember to sign up for MyChart if you have not done so, as this will be important to you in the future with finding out test results, communicating by private email, and scheduling acute appointments online when needed.  Please return in 1 year for your yearly visit, or sooner if needed, with Lab testing done 3-5 days before

## 2017-01-02 NOTE — Progress Notes (Signed)
Subjective:    Patient ID: Austin [name], male    DOB: Mar 24, 1941, 76 y.o.   MRN: [id number]  HPI  Here for wellness and f/u;  Overall doing ok;  Pt denies Chest pain, worsening SOB, DOE, wheezing, orthopnea, PND, worsening LE edema, palpitations, dizziness or syncope.  Pt denies neurological change such as new headache, facial or extremity weakness.  Pt denies polydipsia, polyuria, or low sugar symptoms. Pt states overall good compliance with treatment and medications, good tolerability, and has been trying to follow appropriate diet.  Pt denies worsening depressive symptoms, suicidal ideation or panic. No fever, night sweats, wt loss, loss of appetite, or other constitutional symptoms.  Pt states good ability with ADL's, has low fall risk, home safety reviewed and adequate, no other significant changes in hearing or vision, and only occasionally active with exercise. Has seen Dr Janice Norrie Lowella Curb with elev psa in the past , better with antibx.  Denies urinary symptoms such as dysuria, frequency, urgency, flank pain, hematuria or n/v, fever, chills. Past Medical History:  Diagnosis Date  . Allergy   . BPH (benign prostatic hypertrophy)   . History of nephrolithiasis   . Hypertension   . Low back pain    Past Surgical History:  Procedure Laterality Date  . COLONOSCOPY    . HEMORRHOID SURGERY    . none      reports that he has quit smoking. He has never used smokeless tobacco. He reports that he drinks alcohol. He reports that he does not use drugs. family history includes COPD in his other; Cancer in his other; Diabetes in his mother; Heart disease in his other; Psoriasis in his other. Allergies  Allergen Reactions  . Iohexol Hives  . Ivp Dye [Iodinated Diagnostic Agents] Hives   Current Outpatient Prescriptions on File Prior to Visit  Medication Sig Dispense Refill  . aspirin 81 MG tablet Take 81 mg by mouth daily.       No current facility-administered medications on file prior to  visit.    Review of Systems Constitutional: Negative for increased diaphoresis, or other activity, appetite or siginficant weight change other than noted HENT: Negative for worsening hearing loss, ear pain, facial swelling, mouth sores and neck stiffness.   Eyes: Negative for other worsening pain, redness or visual disturbance.  Respiratory: Negative for choking or stridor Cardiovascular: Negative for other chest pain and palpitations.  Gastrointestinal: Negative for worsening diarrhea, blood in stool, or abdominal distention Genitourinary: Negative for hematuria, flank pain or change in urine volume.  Musculoskeletal: Negative for myalgias or other joint complaints.  Skin: Negative for other color change and wound or drainage.  Neurological: Negative for syncope and numbness. other than noted Hematological: Negative for adenopathy. or other swelling Psychiatric/Behavioral: Negative for hallucinations, SI, self-injury, decreased concentration or other worsening agitation.  All other system neg per pt   Objective:   Physical Exam BP 126/80   Pulse 72   Temp 98.2 F (36.8 C) (Oral)   Ht 6' (1.829 m)   Wt 191 lb (86.6 kg)   SpO2 98%   BMI 25.90 kg/m  VS noted,  Constitutional: Pt is oriented to person, place, and time. Appears well-developed and well-nourished, in no significant distress Head: Normocephalic and atraumatic  Eyes: Conjunctivae and EOM are normal. Pupils are equal, round, and reactive to light Right Ear: External ear normal.  Left Ear: External ear normal Nose: Nose normal.  Mouth/Throat: Oropharynx is clear and moist  Neck: Normal range of motion.  Neck supple. No JVD present. No tracheal deviation present or significant neck LA or mass Cardiovascular: Normal rate, regular rhythm, normal heart sounds and intact distal pulses.   Pulmonary/Chest: Effort normal and breath sounds without rales or wheezing  Abdominal: Soft. Bowel sounds are normal. NT. No HSM    Musculoskeletal: Normal range of motion. Exhibits no edema Lymphadenopathy: Has no cervical adenopathy.  Neurological: Pt is alert and oriented to person, place, and time. Pt has normal reflexes. No cranial nerve deficit. Motor grossly intact Skin: Skin is warm and dry. No rash noted or new ulcers Psychiatric:  Has normal mood and affect. Behavior is normal.  No other exam findings    Assessment & Plan:

## 2017-01-02 NOTE — Progress Notes (Signed)
Pre visit review using our clinic review tool, if applicable. No additional management support is needed unless otherwise documented below in the visit note. 

## 2017-01-06 NOTE — Assessment & Plan Note (Signed)
Lab Results  Component Value Date   PSA 4.37 (H) 12/25/2016   PSA 2.75 12/08/2015   PSA 3.20 08/31/2014  for 4 wks antibx, then repeat PSA, if persistent elevated will need urology referral

## 2017-01-06 NOTE — Assessment & Plan Note (Signed)

## 2017-01-31 ENCOUNTER — Other Ambulatory Visit (INDEPENDENT_AMBULATORY_CARE_PROVIDER_SITE_OTHER): Payer: Medicare Other

## 2017-01-31 ENCOUNTER — Other Ambulatory Visit: Payer: Self-pay | Admitting: Internal Medicine

## 2017-01-31 DIAGNOSIS — R972 Elevated prostate specific antigen [PSA]: Secondary | ICD-10-CM | POA: Diagnosis not present

## 2017-01-31 LAB — PSA: PSA: 3.24 ng/mL (ref 0.10–4.00)

## 2017-02-01 ENCOUNTER — Telehealth: Payer: Self-pay

## 2017-02-01 NOTE — Telephone Encounter (Signed)
Called pt, he told me that he had viewed his results online last night but appreciated the call.

## 2017-02-01 NOTE — Telephone Encounter (Signed)
-----   Message from Biagio Borg, MD sent at 01/31/2017  9:35 PM EDT ----- Left message on MyChart, pt to cont same tx except  The test results show that your current treatment is OK, as the PSA is improved.  We can hold on further referral at this time.  Please return to the lab in 6 months to repeat the test.      Austin [name] to please inform pt, I will do order

## 2017-02-02 ENCOUNTER — Other Ambulatory Visit: Payer: Self-pay | Admitting: Internal Medicine

## 2017-03-05 ENCOUNTER — Telehealth: Payer: Self-pay | Admitting: Internal Medicine

## 2017-03-05 NOTE — Telephone Encounter (Signed)
Spoke with Shanon Brow regarding AWV. Patient stated that he is not interested in scheduling an appt.

## 2017-07-19 ENCOUNTER — Encounter: Payer: Self-pay | Admitting: Internal Medicine

## 2017-07-19 ENCOUNTER — Ambulatory Visit (INDEPENDENT_AMBULATORY_CARE_PROVIDER_SITE_OTHER): Payer: Medicare Other | Admitting: Internal Medicine

## 2017-07-19 ENCOUNTER — Other Ambulatory Visit (INDEPENDENT_AMBULATORY_CARE_PROVIDER_SITE_OTHER): Payer: Medicare Other

## 2017-07-19 VITALS — BP 128/78 | HR 72 | Temp 98.0°F | Ht 72.0 in | Wt 187.0 lb

## 2017-07-19 DIAGNOSIS — I951 Orthostatic hypotension: Secondary | ICD-10-CM | POA: Diagnosis not present

## 2017-07-19 DIAGNOSIS — N400 Enlarged prostate without lower urinary tract symptoms: Secondary | ICD-10-CM

## 2017-07-19 DIAGNOSIS — I10 Essential (primary) hypertension: Secondary | ICD-10-CM

## 2017-07-19 LAB — BASIC METABOLIC PANEL
BUN: 13 mg/dL (ref 6–23)
CALCIUM: 9.4 mg/dL (ref 8.4–10.5)
CO2: 31 meq/L (ref 19–32)
CREATININE: 0.87 mg/dL (ref 0.40–1.50)
Chloride: 102 mEq/L (ref 96–112)
GFR: 91.19 mL/min (ref >60.00)
Glucose, Bld: 99 mg/dL (ref 70–99)
Potassium: 4 mEq/L (ref 3.5–5.1)
Sodium: 139 mEq/L (ref 135–145)

## 2017-07-19 LAB — URINALYSIS, ROUTINE W REFLEX MICROSCOPIC
BILIRUBIN URINE: NEGATIVE
HGB URINE DIPSTICK: NEGATIVE
Ketones, ur: NEGATIVE
LEUKOCYTES UA: NEGATIVE
NITRITE: NEGATIVE
PH: 6.5 (ref 5.0–8.0)
Specific Gravity, Urine: 1.025 (ref 1.000–1.030)
TOTAL PROTEIN, URINE-UPE24: NEGATIVE
URINE GLUCOSE: NEGATIVE
Urobilinogen, UA: 0.2 (ref 0.0–1.0)
WBC, UA: NONE SEEN (ref 0–?)

## 2017-07-19 LAB — HEPATIC FUNCTION PANEL
ALT: 9 U/L (ref 0–53)
AST: 12 U/L (ref 0–37)
Albumin: 4.3 g/dL (ref 3.5–5.2)
Alkaline Phosphatase: 56 U/L (ref 39–117)
BILIRUBIN DIRECT: 0.1 mg/dL (ref 0.0–0.3)
BILIRUBIN TOTAL: 0.4 mg/dL (ref 0.2–1.2)
TOTAL PROTEIN: 7.2 g/dL (ref 6.0–8.3)

## 2017-07-19 LAB — CBC WITH DIFFERENTIAL/PLATELET
BASOS ABS: 0.1 10*3/uL (ref 0.0–0.1)
Basophils Relative: 0.8 % (ref 0.0–3.0)
EOS ABS: 0.2 10*3/uL (ref 0.0–0.7)
Eosinophils Relative: 4.1 % (ref 0.0–5.0)
HEMATOCRIT: 41.6 % (ref 39.0–52.0)
HEMOGLOBIN: 13.6 g/dL (ref 13.0–17.0)
LYMPHS PCT: 18 % (ref 12.0–46.0)
Lymphs Abs: 1.1 10*3/uL (ref 0.7–4.0)
MCHC: 32.6 g/dL (ref 30.0–36.0)
MCV: 94.2 fl (ref 78.0–100.0)
MONOS PCT: 9.2 % (ref 3.0–12.0)
Monocytes Absolute: 0.6 10*3/uL (ref 0.1–1.0)
NEUTROS ABS: 4.1 10*3/uL (ref 1.4–7.7)
Neutrophils Relative %: 67.9 % (ref 43.0–77.0)
PLATELETS: 177 10*3/uL (ref 150.0–400.0)
RBC: 4.41 Mil/uL (ref 4.22–5.81)
RDW: 13.1 % (ref 11.5–15.5)
WBC: 6.1 10*3/uL (ref 4.0–10.5)

## 2017-07-19 NOTE — Patient Instructions (Addendum)
OK to hold the Altace 5 mg per day  Please drink 6-8 glasses gatorade (or powerade or pedialyte) per day for the next 3 days  Please continue all other medications as before, and refills have been done if requested.  Please have the pharmacy call with any other refills you may need.  Please keep your appointments with your specialists as you may have planned  Please go to the LAB in the Basement (turn left off the elevator) for the tests to be done today  You will be contacted by phone if any changes need to be made immediately.  Otherwise, you will receive a letter about your results with an explanation, but please check with MyChart first.  Please remember to sign up for MyChart if you have not done so, as this will be important to you in the future with finding out test results, communicating by private email, and scheduling acute appointments online when needed.

## 2017-07-19 NOTE — Progress Notes (Signed)
Subjective:    Patient ID: Austin [name], male    DOB: 11-May-1941, 76 y.o.   MRN: [id number]  HPI here with relatively sudden onset feeling lightheaded, weakness x 2-3 days, without falls or syncope. Admits to possibly not keeping up well with fluids recently, but Denies worsening reflux, abd pain, dysphagia, n/v, bowel change or blood.  Pt denies chest pain, increased sob or doe, wheezing, orthopnea, PND, increased LE swelling, palpitations.  Pt denies fever, wt loss, night sweats, loss of appetite, or other constitutional symptoms  Denies worsening depressive symptoms, suicidal ideation, or panic; Pt denies new neurological symptoms such as new headache, or facial or extremity weakness or numbness   Pt denies polydipsia, polyuria.  Overall good compliance with treatment, and good medicine tolerability, including the lower dose ACE.  No hx of autonomic neuropathy.  Denies urinary symptoms such as dysuria, frequency, urgency, flank pain, hematuria . BP Readings from Last 3 Encounters:  07/19/17 128/78  01/02/17 126/80  02/02/16 114/63    Wt Readings from Last 3 Encounters:  07/19/17 187 lb (84.8 kg)  01/02/17 191 lb (86.6 kg)  02/02/16 188 lb (85.3 kg)   Past Medical History:  Diagnosis Date  . Allergy   . BPH (benign prostatic hypertrophy)   . History of nephrolithiasis   . Hypertension   . Low back pain    Past Surgical History:  Procedure Laterality Date  . COLONOSCOPY    . HEMORRHOID SURGERY    . none      reports that he has quit smoking. He has never used smokeless tobacco. He reports that he drinks alcohol. He reports that he does not use drugs. family history includes COPD in his other; Cancer in his other; Diabetes in his mother; Heart disease in his other; Psoriasis in his other. Allergies  Allergen Reactions  . Iohexol Hives  . Ivp Dye [Iodinated Diagnostic Agents] Hives   Current Outpatient Prescriptions on File Prior to Visit  Medication Sig Dispense Refill  .  aspirin 81 MG tablet Take 81 mg by mouth daily.      . ramipril (ALTACE) 5 MG capsule Take 1 capsule (5 mg total) by mouth daily. 90 capsule 3   No current facility-administered medications on file prior to visit.    Review of Systems  Constitutional: Negative for other unusual diaphoresis or sweats HENT: Negative for ear discharge or swelling Eyes: Negative for other worsening visual disturbances Respiratory: Negative for stridor or other swelling  Gastrointestinal: Negative for worsening distension or other blood Genitourinary: Negative for retention or other urinary change Musculoskeletal: Negative for other MSK pain or swelling Skin: Negative for color change or other new lesions Neurological: Negative for worsening tremors and other numbness  Psychiatric/Behavioral: Negative for worsening agitation or other fatigue All other system neg per pt    Objective:   Physical Exam BP 128/78   Pulse 72   Temp 98 F (36.7 C) (Oral)   Ht 6' (1.829 m)   Wt 187 lb (84.8 kg)   SpO2 98%   BMI 25.36 kg/m  VS noted, appearing more frail in general, mild weaker in general, not ill appearing Orthostatics: laying 140/86, sitting 118/82, standing 122/78 Constitutional: Pt appears in NAD HENT: Head: NCAT.  Right Ear: External ear normal.  Left Ear: External ear normal.  Eyes: . Pupils are equal, round, and reactive to light. Conjunctivae and EOM are normal Nose: without d/c or deformity Neck: Neck supple. Gross normal ROM Cardiovascular: Normal rate and  regular rhythm.   Pulmonary/Chest: Effort normal and breath sounds without rales or wheezing.  Abd:  Soft, NT, ND, + BS, no organomegaly Neurological: Pt is alert. At baseline orientation, motor grossly intact Skin: Skin is warm. No rashes, other new lesions, no LE edema Psychiatric: Pt behavior is normal without agitation  No other exam findings Lab Results  Component Value Date   WBC 6.1 07/19/2017   HGB 13.6 07/19/2017   HCT 41.6  07/19/2017   PLT 177.0 07/19/2017   GLUCOSE 99 07/19/2017   CHOL 142 12/25/2016   TRIG 68.0 12/25/2016   HDL 53.70 12/25/2016   LDLCALC 75 12/25/2016   ALT 9 07/19/2017   AST 12 07/19/2017   NA 139 07/19/2017   K 4.0 07/19/2017   CL 102 07/19/2017   CREATININE 0.87 07/19/2017   BUN 13 07/19/2017   CO2 31 07/19/2017   TSH 2.15 12/25/2016   PSA 3.24 01/31/2017       Assessment & Plan:

## 2017-07-20 NOTE — Assessment & Plan Note (Signed)
I suspect his orthostasis is more volume related, though cant r/o autonomic or other.  For lab evaluation as above, to increased oral fluids dramatically and hold ACE for 3 days while doing so.  Pt to f/u Mon if not improved or to ED if worsening symptoms or falls.

## 2017-07-20 NOTE — Assessment & Plan Note (Signed)
Asympt, afeb, for UA with labs if he is able though he states he may not be able at this time

## 2017-07-20 NOTE — Assessment & Plan Note (Signed)
Ok to hold the ACE for 3 days, then restart

## 2017-08-14 ENCOUNTER — Encounter: Payer: Self-pay | Admitting: Internal Medicine

## 2017-10-07 ENCOUNTER — Other Ambulatory Visit: Payer: Self-pay | Admitting: Internal Medicine

## 2017-12-23 DIAGNOSIS — M79642 Pain in left hand: Secondary | ICD-10-CM | POA: Diagnosis not present

## 2017-12-25 ENCOUNTER — Encounter: Payer: Self-pay | Admitting: Internal Medicine

## 2017-12-26 NOTE — Telephone Encounter (Signed)
MD is out of office until Monday.. Sent pt mychart informing and he will need to make an appt for evaluation.Marland KitchenJohny Chess

## 2017-12-27 ENCOUNTER — Telehealth: Payer: Self-pay | Admitting: Internal Medicine

## 2017-12-27 DIAGNOSIS — Z0000 Encounter for general adult medical examination without abnormal findings: Secondary | ICD-10-CM

## 2017-12-27 NOTE — Telephone Encounter (Signed)
Patient scheduled for CPE on 3/28.  Patient is requesting labs to be entered before appt date.

## 2017-12-30 NOTE — Telephone Encounter (Signed)
Done

## 2017-12-31 ENCOUNTER — Encounter: Payer: Self-pay | Admitting: Internal Medicine

## 2018-01-09 ENCOUNTER — Other Ambulatory Visit (INDEPENDENT_AMBULATORY_CARE_PROVIDER_SITE_OTHER): Payer: Medicare Other

## 2018-01-09 ENCOUNTER — Ambulatory Visit (INDEPENDENT_AMBULATORY_CARE_PROVIDER_SITE_OTHER): Payer: Medicare Other | Admitting: Internal Medicine

## 2018-01-09 ENCOUNTER — Encounter: Payer: Self-pay | Admitting: Internal Medicine

## 2018-01-09 VITALS — BP 122/76 | HR 58 | Temp 97.9°F | Ht 72.0 in | Wt 192.0 lb

## 2018-01-09 DIAGNOSIS — I10 Essential (primary) hypertension: Secondary | ICD-10-CM

## 2018-01-09 DIAGNOSIS — R21 Rash and other nonspecific skin eruption: Secondary | ICD-10-CM | POA: Diagnosis not present

## 2018-01-09 DIAGNOSIS — Z0000 Encounter for general adult medical examination without abnormal findings: Secondary | ICD-10-CM

## 2018-01-09 LAB — PSA: PSA: 4.04 ng/mL — ABNORMAL HIGH (ref 0.10–4.00)

## 2018-01-09 LAB — BASIC METABOLIC PANEL
BUN: 17 mg/dL (ref 6–23)
CHLORIDE: 104 meq/L (ref 96–112)
CO2: 30 meq/L (ref 19–32)
Calcium: 9.3 mg/dL (ref 8.4–10.5)
Creatinine, Ser: 0.89 mg/dL (ref 0.40–1.50)
GFR: 88.71 mL/min (ref >60.00)
GLUCOSE: 111 mg/dL — AB (ref 70–99)
POTASSIUM: 4.8 meq/L (ref 3.5–5.1)
Sodium: 140 mEq/L (ref 135–145)

## 2018-01-09 LAB — HEPATIC FUNCTION PANEL
ALT: 15 U/L (ref 0–53)
AST: 18 U/L (ref 0–37)
Albumin: 4.3 g/dL (ref 3.5–5.2)
Alkaline Phosphatase: 53 U/L (ref 39–117)
BILIRUBIN DIRECT: 0.1 mg/dL (ref 0.0–0.3)
BILIRUBIN TOTAL: 0.7 mg/dL (ref 0.2–1.2)
Total Protein: 6.9 g/dL (ref 6.0–8.3)

## 2018-01-09 LAB — URINALYSIS, ROUTINE W REFLEX MICROSCOPIC
BILIRUBIN URINE: NEGATIVE
Hgb urine dipstick: NEGATIVE
KETONES UR: NEGATIVE
LEUKOCYTES UA: NEGATIVE
Nitrite: NEGATIVE
RBC / HPF: NONE SEEN (ref 0–?)
Specific Gravity, Urine: 1.01 (ref 1.000–1.030)
Total Protein, Urine: NEGATIVE
Urine Glucose: NEGATIVE
Urobilinogen, UA: 0.2 (ref 0.0–1.0)
pH: 6 (ref 5.0–8.0)

## 2018-01-09 LAB — CBC WITH DIFFERENTIAL/PLATELET
BASOS PCT: 1.2 % (ref 0.0–3.0)
Basophils Absolute: 0.1 10*3/uL (ref 0.0–0.1)
EOS ABS: 0.2 10*3/uL (ref 0.0–0.7)
Eosinophils Relative: 3.8 % (ref 0.0–5.0)
HCT: 42.9 % (ref 39.0–52.0)
HEMOGLOBIN: 14.4 g/dL (ref 13.0–17.0)
LYMPHS ABS: 1 10*3/uL (ref 0.7–4.0)
Lymphocytes Relative: 21.7 % (ref 12.0–46.0)
MCHC: 33.7 g/dL (ref 30.0–36.0)
MCV: 92.5 fl (ref 78.0–100.0)
MONO ABS: 0.5 10*3/uL (ref 0.1–1.0)
Monocytes Relative: 10.8 % (ref 3.0–12.0)
NEUTROS PCT: 62.5 % (ref 43.0–77.0)
Neutro Abs: 3 10*3/uL (ref 1.4–7.7)
PLATELETS: 174 10*3/uL (ref 150.0–400.0)
RBC: 4.64 Mil/uL (ref 4.22–5.81)
RDW: 13 % (ref 11.5–15.5)
WBC: 4.8 10*3/uL (ref 4.0–10.5)

## 2018-01-09 LAB — LIPID PANEL
CHOLESTEROL: 142 mg/dL (ref 0–200)
HDL: 60.4 mg/dL (ref >39.00)
LDL CALC: 73 mg/dL (ref 0–99)
NonHDL: 82.08
TRIGLYCERIDES: 46 mg/dL (ref 0.0–149.0)
Total CHOL/HDL Ratio: 2
VLDL: 9.2 mg/dL (ref 0.0–40.0)

## 2018-01-09 LAB — TSH: TSH: 1.8 u[IU]/mL (ref 0.35–4.50)

## 2018-01-09 MED ORDER — RAMIPRIL 5 MG PO CAPS: 5.0000 mg | ORAL_CAPSULE | Freq: Every day | ORAL | 3 refills | Status: DC

## 2018-01-09 MED ORDER — TRIAMCINOLONE ACETONIDE 0.1 % EX CREA: 1.0000 "application " | TOPICAL_CREAM | Freq: Two times a day (BID) | CUTANEOUS | 0 refills | Status: DC

## 2018-01-09 MED ORDER — PREDNISONE 10 MG PO TABS: ORAL_TABLET | ORAL | 0 refills | Status: DC

## 2018-01-09 NOTE — Patient Instructions (Addendum)
Please take all new medication as prescribed - the prednisone and cream (sent to [facility])  Please continue all other medications as before, and refills have been done if requested.  Please have the pharmacy call with any other refills you may need.  Please continue your efforts at being more active, low cholesterol diet, and weight control.  You are otherwise up to date with prevention measures today.  Please keep your appointments with your specialists as you may have planned  Please go to the LAB in the Basement (turn left off the elevator) for the tests to be done today  You will be contacted by phone if any changes need to be made immediately.  Otherwise, you will receive a letter about your results with an explanation, but please check with MyChart first.  Please remember to sign up for MyChart if you have not done so, as this will be important to you in the future with finding out test results, communicating by private email, and scheduling acute appointments online when needed.  Please return in 1 year for your yearly visit, or sooner if needed, with Lab testing done 3-5 days before

## 2018-01-09 NOTE — Progress Notes (Signed)
Subjective:    Patient ID: Austin [name], male    DOB: Jul 24, 1941, 77 y.o.   MRN: [id number]  HPI  Here for wellness and f/u;  Overall doing ok;  Pt denies Chest pain, worsening SOB, DOE, wheezing, orthopnea, PND, worsening LE edema, palpitations, dizziness or syncope.  Pt denies neurological change such as new headache, facial or extremity weakness.  Pt denies polydipsia, polyuria, or low sugar symptoms. Pt states overall good compliance with treatment and medications, good tolerability, and has been trying to follow appropriate diet.  Pt denies worsening depressive symptoms, suicidal ideation or panic. No fever, night sweats, wt loss, loss of appetite, or other constitutional symptoms.  Pt states good ability with ADL's, has low fall risk, home safety reviewed and adequate, no other significant changes in hearing or vision, and occasionally active with exercise. Also eczema worse recenlty, asks for tx, could not get ederm appt until April.  No other interval hx or new complaints Past Medical History:  Diagnosis Date  . Allergy   . BPH (benign prostatic hypertrophy)   . History of nephrolithiasis   . Hypertension   . Low back pain    Past Surgical History:  Procedure Laterality Date  . COLONOSCOPY    . HEMORRHOID SURGERY    . none      reports that he has quit smoking. He has never used smokeless tobacco. He reports that he drinks alcohol. He reports that he does not use drugs. family history includes COPD in his other; Cancer in his other; Diabetes in his mother; Heart disease in his other; Psoriasis in his other. Allergies  Allergen Reactions  . Iohexol Hives  . Ivp Dye [Iodinated Diagnostic Agents] Hives   Current Outpatient Medications on File Prior to Visit  Medication Sig Dispense Refill  . aspirin 81 MG tablet Take 81 mg by mouth daily.       No current facility-administered medications on file prior to visit.    Review of Systems Constitutional: Negative for other  unusual diaphoresis, sweats, appetite or weight changes HENT: Negative for other worsening hearing loss, ear pain, facial swelling, mouth sores or neck stiffness.   Eyes: Negative for other worsening pain, redness or other visual disturbance.  Respiratory: Negative for other stridor or swelling Cardiovascular: Negative for other palpitations or other chest pain  Gastrointestinal: Negative for worsening diarrhea or loose stools, blood in stool, distention or other pain Genitourinary: Negative for hematuria, flank pain or other change in urine volume.  Musculoskeletal: Negative for myalgias or other joint swelling.  Skin: Negative for other color change, or other wound or worsening drainage.  Neurological: Negative for other syncope or numbness. Hematological: Negative for other adenopathy or swelling Psychiatric/Behavioral: Negative for hallucinations, other worsening agitation, SI, self-injury, or new decreased concentration All other system neg per pt    Objective:   Physical Exam BP 122/76   Pulse (!) 58   Temp 97.9 F (36.6 C) (Oral)   Ht 6' (1.829 m)   Wt 192 lb (87.1 kg)   SpO2 97%   BMI 26.04 kg/m  VS noted,  Constitutional: Pt is oriented to person, place, and time. Appears well-developed and well-nourished, in no significant distress and comfortable Head: Normocephalic and atraumatic  Eyes: Conjunctivae and EOM are normal. Pupils are equal, round, and reactive to light Right Ear: External ear normal without discharge Left Ear: External ear normal without discharge Nose: Nose without discharge or deformity Mouth/Throat: Oropharynx is without other ulcerations and moist  Neck: Normal range of motion. Neck supple. No JVD present. No tracheal deviation present or significant neck LA or mass Cardiovascular: Normal rate, regular rhythm, normal heart sounds and intact distal pulses.   Pulmonary/Chest: WOB normal and breath sounds without rales or wheezing  Abdominal: Soft. Bowel  sounds are normal. NT. No HSM  Musculoskeletal: Normal range of motion. Exhibits no edema Lymphadenopathy: Has no other cervical adenopathy.  Neurological: Pt is alert and oriented to person, place, and time. Pt has normal reflexes. No cranial nerve deficit. Motor grossly intact, Gait intact Skin: Skin is warm and dry. + mult areas of torso and extremities eczema rash noted or new ulcerations Psychiatric:  Has normal mood and affect. Behavior is normal without agitation No other exam findings  Lab Results  Component Value Date   WBC 4.8 01/09/2018   HGB 14.4 01/09/2018   HCT 42.9 01/09/2018   PLT 174.0 01/09/2018   GLUCOSE 111 (H) 01/09/2018   CHOL 142 01/09/2018   TRIG 46.0 01/09/2018   HDL 60.40 01/09/2018   LDLCALC 73 01/09/2018   ALT 15 01/09/2018   AST 18 01/09/2018   NA 140 01/09/2018   K 4.8 01/09/2018   CL 104 01/09/2018   CREATININE 0.89 01/09/2018   BUN 17 01/09/2018   CO2 30 01/09/2018   TSH 1.80 01/09/2018   PSA 4.04 (H) 01/09/2018      Assessment & Plan:

## 2018-01-12 NOTE — Assessment & Plan Note (Signed)
BP Readings from Last 3 Encounters:  01/09/18 122/76  07/19/17 128/78  01/02/17 126/80

## 2018-01-12 NOTE — Assessment & Plan Note (Signed)
C/w eczema flare mod to severe, for depomedrol IM 80, topical steroid asd,  to f/u any worsening symptoms or concerns

## 2018-01-12 NOTE — Assessment & Plan Note (Signed)

## 2018-05-20 ENCOUNTER — Telehealth: Payer: Self-pay | Admitting: Emergency Medicine

## 2018-05-20 NOTE — Telephone Encounter (Signed)
Called patient to schedule AWV. Patient declined at this time. 

## 2018-09-03 DIAGNOSIS — H25813 Combined forms of age-related cataract, bilateral: Secondary | ICD-10-CM | POA: Diagnosis not present

## 2018-09-23 ENCOUNTER — Encounter: Payer: Self-pay | Admitting: Internal Medicine

## 2018-09-23 ENCOUNTER — Ambulatory Visit (INDEPENDENT_AMBULATORY_CARE_PROVIDER_SITE_OTHER)
Admission: RE | Admit: 2018-09-23 | Discharge: 2018-09-23 | Disposition: A | Discharge status: Home / Self Care | Payer: Medicare Other | Source: Ambulatory Visit | Attending: Internal Medicine | Admitting: Internal Medicine

## 2018-09-23 ENCOUNTER — Ambulatory Visit (INDEPENDENT_AMBULATORY_CARE_PROVIDER_SITE_OTHER): Payer: Medicare Other | Admitting: Internal Medicine

## 2018-09-23 VITALS — BP 124/82 | HR 79 | Temp 98.0°F | Ht 72.0 in | Wt 195.0 lb

## 2018-09-23 DIAGNOSIS — R059 Cough, unspecified: Secondary | ICD-10-CM | POA: Insufficient documentation

## 2018-09-23 DIAGNOSIS — R05 Cough: Secondary | ICD-10-CM

## 2018-09-23 DIAGNOSIS — I10 Essential (primary) hypertension: Secondary | ICD-10-CM

## 2018-09-23 MED ORDER — LEVOFLOXACIN 500 MG PO TABS: 500.0000 mg | ORAL_TABLET | Freq: Every day | ORAL | 0 refills | Status: AC

## 2018-09-23 MED ORDER — HYDROCODONE-HOMATROPINE 5-1.5 MG/5ML PO SYRP: 5.0000 mL | ORAL_SOLUTION | Freq: Four times a day (QID) | ORAL | 0 refills | Status: AC | PRN

## 2018-09-23 NOTE — Assessment & Plan Note (Signed)
stable overall by history and exam, recent data reviewed with pt, and pt to continue medical treatment as before,  to f/u any worsening symptoms or concerns  

## 2018-09-23 NOTE — Assessment & Plan Note (Signed)
Mild to mod, c/w bronchitis vs pna, for cxr, for antibx course, cough med prn,  to f/u any worsening symptoms or concerns 

## 2018-09-23 NOTE — Patient Instructions (Signed)
Please take all new medication as prescribed - the antibiotic, and cough medicine as needed  Please continue all other medications as before, and refills have been done if requested.  Please have the pharmacy call with any other refills you may need.  Please continue your efforts at being more active, low cholesterol diet, and weight control.  Please keep your appointments with your specialists as you may have planned  Please go to the XRAY Department in the Basement (go straight as you get off the elevator) for the x-ray testing  You will be contacted by phone if any changes need to be made immediately.  Otherwise, you will receive a letter about your results with an explanation, but please check with MyChart first.  Please remember to sign up for MyChart if you have not done so, as this will be important to you in the future with finding out test results, communicating by private email, and scheduling acute appointments online when needed.

## 2018-09-23 NOTE — Progress Notes (Signed)
Subjective:    Patient ID: Austin [name], male    DOB: 09/28/1941, 77 y.o.   MRN: [id number]  HPI  Here with acute onset mild to mod 2-3 wks ST, HA, general weakness and malaise, with prod cough greenish and now brown sputum, but Pt denies chest pain, increased sob or doe, wheezing, orthopnea, PND, increased LE swelling, palpitations, dizziness or syncope.  Pt denies new neurological symptoms such as new headache, or facial or extremity weakness or numbness   Pt denies polydipsia, polyuria.   Past Medical History:  Diagnosis Date  . Allergy   . BPH (benign prostatic hypertrophy)   . History of nephrolithiasis   . Hypertension   . Low back pain    Past Surgical History:  Procedure Laterality Date  . COLONOSCOPY    . HEMORRHOID SURGERY    . none      reports that he has quit smoking. He has never used smokeless tobacco. He reports that he drinks alcohol. He reports that he does not use drugs. family history includes COPD in his other; Cancer in his other; Diabetes in his mother; Heart disease in his other; Psoriasis in his other. Allergies  Allergen Reactions  . Iohexol Hives  . Ivp Dye [Iodinated Diagnostic Agents] Hives   Current Outpatient Medications on File Prior to Visit  Medication Sig Dispense Refill  . aspirin 81 MG tablet Take 81 mg by mouth daily.      . ramipril (ALTACE) 5 MG capsule Take 1 capsule (5 mg total) by mouth daily. 90 capsule 3  . triamcinolone cream (KENALOG) 0.1 % Apply 1 application topically 2 (two) times daily. 30 g 0   No current facility-administered medications on file prior to visit.    Review of Systems  Constitutional: Negative for other unusual diaphoresis or sweats HENT: Negative for ear discharge or swelling Eyes: Negative for other worsening visual disturbances Respiratory: Negative for stridor or other swelling  Gastrointestinal: Negative for worsening distension or other blood Genitourinary: Negative for retention or other urinary  change Musculoskeletal: Negative for other MSK pain or swelling Skin: Negative for color change or other new lesions Neurological: Negative for worsening tremors and other numbness  Psychiatric/Behavioral: Negative for worsening agitation or other fatigue All other system neg per pt    Objective:   Physical Exam BP 124/82   Pulse 79   Temp 98 F (36.7 C) (Oral)   Ht 6' (1.829 m)   Wt 195 lb (88.5 kg)   SpO2 97%   BMI 26.45 kg/m  VS noted,  Constitutional: Pt appears in NAD HENT: Head: NCAT.  Right Ear: External ear normal.  Left Ear: External ear normal.  Eyes: . Pupils are equal, round, and reactive to light. Conjunctivae and EOM are normal Nose: without d/c or deformity Neck: Neck supple. Gross normal ROM Cardiovascular: Normal rate and regular rhythm.   Pulmonary/Chest: Effort normal and breath sounds decreased with few LLL rales but no wheezing.  Neurological: Pt is alert. At baseline orientation, motor grossly intact Skin: Skin is warm. No rashes, other new lesions, no LE edema Psychiatric: Pt behavior is normal without agitation  No other exam findings Lab Results  Component Value Date   WBC 4.8 01/09/2018   HGB 14.4 01/09/2018   HCT 42.9 01/09/2018   PLT 174.0 01/09/2018   GLUCOSE 111 (H) 01/09/2018   CHOL 142 01/09/2018   TRIG 46.0 01/09/2018   HDL 60.40 01/09/2018   LDLCALC 73 01/09/2018   ALT 15  01/09/2018   AST 18 01/09/2018   NA 140 01/09/2018   K 4.8 01/09/2018   CL 104 01/09/2018   CREATININE 0.89 01/09/2018   BUN 17 01/09/2018   CO2 30 01/09/2018   TSH 1.80 01/09/2018   PSA 4.04 (H) 01/09/2018      Assessment & Plan:

## 2018-10-15 ENCOUNTER — Encounter: Payer: Self-pay | Admitting: Internal Medicine

## 2018-10-16 MED ORDER — HYDROCODONE-HOMATROPINE 5-1.5 MG/5ML PO SYRP: 5.0000 mL | ORAL_SOLUTION | Freq: Four times a day (QID) | ORAL | 0 refills | Status: AC | PRN

## 2018-10-16 NOTE — Telephone Encounter (Signed)
Done erx 

## 2018-11-09 DIAGNOSIS — S61204A Unspecified open wound of right ring finger without damage to nail, initial encounter: Secondary | ICD-10-CM | POA: Diagnosis not present

## 2018-12-08 ENCOUNTER — Other Ambulatory Visit: Payer: Self-pay | Admitting: Internal Medicine

## 2018-12-08 ENCOUNTER — Encounter: Payer: Self-pay | Admitting: Internal Medicine

## 2018-12-08 MED ORDER — TRIAMCINOLONE ACETONIDE 0.1 % EX CREA: 1.0000 "application " | TOPICAL_CREAM | Freq: Two times a day (BID) | CUTANEOUS | 2 refills | Status: DC

## 2018-12-08 MED ORDER — TRIAMCINOLONE ACETONIDE 0.1 % EX CREA: 1.0000 "application " | TOPICAL_CREAM | Freq: Two times a day (BID) | CUTANEOUS | 0 refills | Status: DC

## 2018-12-08 NOTE — Telephone Encounter (Signed)
Done erx 

## 2018-12-30 ENCOUNTER — Encounter: Payer: Self-pay | Admitting: Internal Medicine

## 2019-01-04 ENCOUNTER — Other Ambulatory Visit: Payer: Self-pay | Admitting: Internal Medicine

## 2019-03-19 ENCOUNTER — Encounter: Payer: Self-pay | Admitting: Internal Medicine

## 2019-03-19 MED ORDER — TRIAMCINOLONE ACETONIDE 0.1 % EX CREA: 1.0000 "application " | TOPICAL_CREAM | Freq: Two times a day (BID) | CUTANEOUS | 2 refills | Status: DC

## 2019-06-01 ENCOUNTER — Telehealth: Payer: Self-pay

## 2019-06-01 DIAGNOSIS — Z0000 Encounter for general adult medical examination without abnormal findings: Secondary | ICD-10-CM

## 2019-06-01 NOTE — Telephone Encounter (Signed)
LABS ORDERED.

## 2019-06-17 ENCOUNTER — Encounter: Payer: Self-pay | Admitting: Internal Medicine

## 2019-06-17 ENCOUNTER — Other Ambulatory Visit: Payer: Self-pay

## 2019-06-17 ENCOUNTER — Other Ambulatory Visit: Payer: Self-pay | Admitting: Internal Medicine

## 2019-06-17 ENCOUNTER — Ambulatory Visit (INDEPENDENT_AMBULATORY_CARE_PROVIDER_SITE_OTHER): Payer: Medicare Other | Admitting: Internal Medicine

## 2019-06-17 ENCOUNTER — Other Ambulatory Visit (INDEPENDENT_AMBULATORY_CARE_PROVIDER_SITE_OTHER): Payer: Medicare Other

## 2019-06-17 VITALS — BP 124/78 | HR 63 | Temp 97.9°F | Ht 72.0 in | Wt 195.0 lb

## 2019-06-17 DIAGNOSIS — R972 Elevated prostate specific antigen [PSA]: Secondary | ICD-10-CM

## 2019-06-17 DIAGNOSIS — D369 Benign neoplasm, unspecified site: Secondary | ICD-10-CM | POA: Diagnosis not present

## 2019-06-17 DIAGNOSIS — E538 Deficiency of other specified B group vitamins: Secondary | ICD-10-CM

## 2019-06-17 DIAGNOSIS — E611 Iron deficiency: Secondary | ICD-10-CM | POA: Diagnosis not present

## 2019-06-17 DIAGNOSIS — E559 Vitamin D deficiency, unspecified: Secondary | ICD-10-CM

## 2019-06-17 DIAGNOSIS — R739 Hyperglycemia, unspecified: Secondary | ICD-10-CM | POA: Diagnosis not present

## 2019-06-17 DIAGNOSIS — Z23 Encounter for immunization: Secondary | ICD-10-CM | POA: Diagnosis not present

## 2019-06-17 DIAGNOSIS — Z0000 Encounter for general adult medical examination without abnormal findings: Secondary | ICD-10-CM | POA: Diagnosis not present

## 2019-06-17 LAB — URINALYSIS, ROUTINE W REFLEX MICROSCOPIC
Bilirubin Urine: NEGATIVE
Hgb urine dipstick: NEGATIVE
Ketones, ur: NEGATIVE
Leukocytes,Ua: NEGATIVE
Nitrite: NEGATIVE
RBC / HPF: NONE SEEN (ref 0–?)
Specific Gravity, Urine: 1.01 (ref 1.000–1.030)
Total Protein, Urine: NEGATIVE
Urine Glucose: NEGATIVE
Urobilinogen, UA: 0.2 (ref 0.0–1.0)
pH: 6.5 (ref 5.0–8.0)

## 2019-06-17 LAB — BASIC METABOLIC PANEL
BUN: 16 mg/dL (ref 6–23)
CO2: 29 mEq/L (ref 19–32)
Calcium: 9.1 mg/dL (ref 8.4–10.5)
Chloride: 104 mEq/L (ref 96–112)
Creatinine, Ser: 0.85 mg/dL (ref 0.40–1.50)
GFR: 87.68 mL/min (ref >60.00)
Glucose, Bld: 94 mg/dL (ref 70–99)
Potassium: 4.2 mEq/L (ref 3.5–5.1)
Sodium: 139 mEq/L (ref 135–145)

## 2019-06-17 LAB — IBC PANEL
Iron: 116 ug/dL (ref 42–165)
Saturation Ratios: 37.7 % (ref 20.0–50.0)
Transferrin: 220 mg/dL (ref 212.0–360.0)

## 2019-06-17 LAB — CBC WITH DIFFERENTIAL/PLATELET
Basophils Absolute: 0.1 10*3/uL (ref 0.0–0.1)
Basophils Relative: 1.2 % (ref 0.0–3.0)
Eosinophils Absolute: 0.4 10*3/uL (ref 0.0–0.7)
Eosinophils Relative: 8.5 % — ABNORMAL HIGH (ref 0.0–5.0)
HCT: 39.9 % (ref 39.0–52.0)
Hemoglobin: 13.6 g/dL (ref 13.0–17.0)
Lymphocytes Relative: 20.7 % (ref 12.0–46.0)
Lymphs Abs: 1 10*3/uL (ref 0.7–4.0)
MCHC: 34.1 g/dL (ref 30.0–36.0)
MCV: 92.8 fl (ref 78.0–100.0)
Monocytes Absolute: 0.5 10*3/uL (ref 0.1–1.0)
Monocytes Relative: 10.5 % (ref 3.0–12.0)
Neutro Abs: 2.7 10*3/uL (ref 1.4–7.7)
Neutrophils Relative %: 59.1 % (ref 43.0–77.0)
Platelets: 164 10*3/uL (ref 150.0–400.0)
RBC: 4.3 Mil/uL (ref 4.22–5.81)
RDW: 13.5 % (ref 11.5–15.5)
WBC: 4.6 10*3/uL (ref 4.0–10.5)

## 2019-06-17 LAB — VITAMIN B12: Vitamin B-12: 157 pg/mL — ABNORMAL LOW (ref 211–911)

## 2019-06-17 LAB — HEMOGLOBIN A1C: Hgb A1c MFr Bld: 5.6 % (ref 4.6–6.5)

## 2019-06-17 LAB — LIPID PANEL
Cholesterol: 132 mg/dL (ref 0–200)
HDL: 64.2 mg/dL (ref >39.00)
LDL Cholesterol: 58 mg/dL (ref 0–99)
NonHDL: 67.69
Total CHOL/HDL Ratio: 2
Triglycerides: 49 mg/dL (ref 0.0–149.0)
VLDL: 9.8 mg/dL (ref 0.0–40.0)

## 2019-06-17 LAB — PSA: PSA: 5.07 ng/mL — ABNORMAL HIGH (ref 0.10–4.00)

## 2019-06-17 LAB — HEPATIC FUNCTION PANEL
ALT: 12 U/L (ref 0–53)
AST: 16 U/L (ref 0–37)
Albumin: 4.3 g/dL (ref 3.5–5.2)
Alkaline Phosphatase: 53 U/L (ref 39–117)
Bilirubin, Direct: 0.2 mg/dL (ref 0.0–0.3)
Total Bilirubin: 0.7 mg/dL (ref 0.2–1.2)
Total Protein: 6.6 g/dL (ref 6.0–8.3)

## 2019-06-17 LAB — VITAMIN D 25 HYDROXY (VIT D DEFICIENCY, FRACTURES): VITD: 13.78 ng/mL — ABNORMAL LOW (ref 30.00–100.00)

## 2019-06-17 LAB — TSH: TSH: 2.33 u[IU]/mL (ref 0.35–4.50)

## 2019-06-17 MED ORDER — VITAMIN D (ERGOCALCIFEROL) 1.25 MG (50000 UNIT) PO CAPS: 50000.0000 [IU] | ORAL_CAPSULE | ORAL | 0 refills | Status: DC

## 2019-06-17 MED ORDER — TRIAMCINOLONE ACETONIDE 0.1 % EX CREA: 1.0000 "application " | TOPICAL_CREAM | Freq: Two times a day (BID) | CUTANEOUS | 2 refills | Status: DC

## 2019-06-17 NOTE — Patient Instructions (Addendum)
You had the flu shot today  You will be contacted regarding the referral for: colonoscopy  Please continue all other medications as before, and refills have been done if requested.  Please have the pharmacy call with any other refills you may need.  Please continue your efforts at being more active, low cholesterol diet, and weight control.  You are otherwise up to date with prevention measures today.  Please keep your appointments with your specialists as you may have planned  Please go to the LAB in the Basement (turn left off the elevator) for the tests to be done today  You will be contacted by phone if any changes need to be made immediately.  Otherwise, you will receive a letter about your results with an explanation, but please check with MyChart first.  Please remember to sign up for MyChart if you have not done so, as this will be important to you in the future with finding out test results, communicating by private email, and scheduling acute appointments online when needed.  Please return in 1 year for your yearly visit, or sooner if needed, with Lab testing done 3-5 days before

## 2019-06-17 NOTE — Assessment & Plan Note (Signed)
Overdue colonoscopy - pt ok with referral

## 2019-06-17 NOTE — Progress Notes (Signed)
Subjective:    Patient ID: Austin [name], male    DOB: Dec 26, 1940, 78 y.o.   MRN: [id number]  HPI  .Here for wellness and f/u;  Overall doing ok;  Pt denies Chest pain, worsening SOB, DOE, wheezing, orthopnea, PND, worsening LE edema, palpitations, dizziness or syncope.  Pt denies neurological change such as new headache, facial or extremity weakness.  Pt denies polydipsia, polyuria, or low sugar symptoms. Pt states overall good compliance with treatment and medications, good tolerability, and has been trying to follow appropriate diet.  Pt denies worsening depressive symptoms, suicidal ideation or panic. No fever, night sweats, wt loss, loss of appetite, or other constitutional symptoms.  Pt states good ability with ADL's, has low fall risk, home safety reviewed and adequate, no other significant changes in hearing or vision, and only occasionally active with exercise.  No new complaints Past Medical History:  Diagnosis Date  . Allergy   . BPH (benign prostatic hypertrophy)   . History of nephrolithiasis   . Hypertension   . Low back pain    Past Surgical History:  Procedure Laterality Date  . COLONOSCOPY    . HEMORRHOID SURGERY    . none      reports that he has quit smoking. He has never used smokeless tobacco. He reports current alcohol use. He reports that he does not use drugs. family history includes COPD in an other family member; Cancer in an other family member; Diabetes in his mother; Heart disease in an other family member; Psoriasis in an other family member. Allergies  Allergen Reactions  . Iohexol Hives  . Ivp Dye [Iodinated Diagnostic Agents] Hives   Current Outpatient Medications on File Prior to Visit  Medication Sig Dispense Refill  . aspirin 81 MG tablet Take 81 mg by mouth daily.      . ramipril (ALTACE) 5 MG capsule TAKE 1 CAPSULE BY MOUTH  DAILY 90 capsule 1   No current facility-administered medications on file prior to visit.    Review of Systems  Constitutional: Negative for other unusual diaphoresis, sweats, appetite or weight changes HENT: Negative for other worsening hearing loss, ear pain, facial swelling, mouth sores or neck stiffness.   Eyes: Negative for other worsening pain, redness or other visual disturbance.  Respiratory: Negative for other stridor or swelling Cardiovascular: Negative for other palpitations or other chest pain  Gastrointestinal: Negative for worsening diarrhea or loose stools, blood in stool, distention or other pain Genitourinary: Negative for hematuria, flank pain or other change in urine volume.  Musculoskeletal: Negative for myalgias or other joint swelling.  Skin: Negative for other color change, or other wound or worsening drainage.  Neurological: Negative for other syncope or numbness. Hematological: Negative for other adenopathy or swelling Psychiatric/Behavioral: Negative for hallucinations, other worsening agitation, SI, self-injury, or new decreased concentration All other system neg per pt    Objective:   Physical Exam BP 124/78   Pulse 63   Temp 97.9 F (36.6 C) (Oral)   Ht 6' (1.829 m)   Wt 195 lb (88.5 kg)   SpO2 98%   BMI 26.45 kg/m  VS noted,  Constitutional: Pt is oriented to person, place, and time. Appears well-developed and well-nourished, in no significant distress and comfortable Head: Normocephalic and atraumatic  Eyes: Conjunctivae and EOM are normal. Pupils are equal, round, and reactive to light Right Ear: External ear normal without discharge Left Ear: External ear normal without discharge Nose: Nose without discharge or deformity Mouth/Throat: Oropharynx is  without other ulcerations and moist  Neck: Normal range of motion. Neck supple. No JVD present. No tracheal deviation present or significant neck LA or mass Cardiovascular: Normal rate, regular rhythm, normal heart sounds and intact distal pulses.   Pulmonary/Chest: WOB normal and breath sounds without rales or  wheezing  Abdominal: Soft. Bowel sounds are normal. NT. No HSM  Musculoskeletal: Normal range of motion. Exhibits no edema Lymphadenopathy: Has no other cervical adenopathy.  Neurological: Pt is alert and oriented to person, place, and time. Pt has normal reflexes. No cranial nerve deficit. Motor grossly intact, Gait intact Skin: Skin is warm and dry. No rash noted or new ulcerations Psychiatric:  Has normal mood and affect. Behavior is normal without agitation No other exam findings Lab Results  Component Value Date   WBC 4.8 01/09/2018   HGB 14.4 01/09/2018   HCT 42.9 01/09/2018   PLT 174.0 01/09/2018   GLUCOSE 111 (H) 01/09/2018   CHOL 142 01/09/2018   TRIG 46.0 01/09/2018   HDL 60.40 01/09/2018   LDLCALC 73 01/09/2018   ALT 15 01/09/2018   AST 18 01/09/2018   NA 140 01/09/2018   K 4.8 01/09/2018   CL 104 01/09/2018   CREATININE 0.89 01/09/2018   BUN 17 01/09/2018   CO2 30 01/09/2018   TSH 1.80 01/09/2018   PSA 4.04 (H) 01/09/2018       Assessment & Plan:

## 2019-06-17 NOTE — Assessment & Plan Note (Signed)

## 2019-06-17 NOTE — Assessment & Plan Note (Signed)
stable overall by history and exam, recent data reviewed with pt, and pt to continue medical treatment as before,  to f/u any worsening symptoms or concerns  

## 2019-06-18 ENCOUNTER — Encounter: Payer: Self-pay | Admitting: Internal Medicine

## 2019-06-18 ENCOUNTER — Telehealth: Payer: Self-pay

## 2019-06-18 NOTE — Telephone Encounter (Signed)
Pt has viewed results via MyChart  

## 2019-06-18 NOTE — Telephone Encounter (Signed)
-----   Message from Biagio Borg, MD sent at 06/17/2019  4:10 PM EDT ----- Left message on MyChart, pt to cont same tx except  The test results show that your current treatment is OK, as the tests are stable, except the Vitamin B12 level is low, the Vitamin D level is low, and the PSA has increased again.  We need to: 1)  Please take Vitamin D 50000 units weekly for 12 weeks, then plan to change to OTC Vitamin D3 at 2000 units per day, indefinitely. 2) start monthly B12 shots monthly until next seen 3)  Refer urology to further evaluate the increasing PSA  Johnson Arizola to please inform pt, I will do rx and referral, and please to help start monthly b12 shots

## 2019-06-19 ENCOUNTER — Ambulatory Visit (INDEPENDENT_AMBULATORY_CARE_PROVIDER_SITE_OTHER): Payer: Medicare Other

## 2019-06-19 DIAGNOSIS — E538 Deficiency of other specified B group vitamins: Secondary | ICD-10-CM

## 2019-06-19 MED ORDER — CYANOCOBALAMIN 1000 MCG/ML IJ SOLN
1000.0000 ug | Freq: Once | INTRAMUSCULAR | Status: AC
  Administered 2019-06-19: 12:00:00 1000 ug via INTRAMUSCULAR

## 2019-06-19 NOTE — Progress Notes (Signed)
Medical screening examination/treatment/procedure(s) were performed by non-physician practitioner and as supervising physician I was immediately available for consultation/collaboration. I agree with above. Karole Oo, MD   

## 2019-06-24 ENCOUNTER — Encounter: Payer: Self-pay | Admitting: Internal Medicine

## 2019-07-02 ENCOUNTER — Other Ambulatory Visit: Payer: Self-pay | Admitting: Internal Medicine

## 2019-07-05 ENCOUNTER — Encounter: Payer: Self-pay | Admitting: Internal Medicine

## 2019-07-06 ENCOUNTER — Encounter: Payer: Self-pay | Admitting: Internal Medicine

## 2019-07-07 DIAGNOSIS — H2513 Age-related nuclear cataract, bilateral: Secondary | ICD-10-CM | POA: Diagnosis not present

## 2019-07-07 DIAGNOSIS — H25043 Posterior subcapsular polar age-related cataract, bilateral: Secondary | ICD-10-CM | POA: Diagnosis not present

## 2019-07-07 DIAGNOSIS — H18413 Arcus senilis, bilateral: Secondary | ICD-10-CM | POA: Diagnosis not present

## 2019-07-07 DIAGNOSIS — H25013 Cortical age-related cataract, bilateral: Secondary | ICD-10-CM | POA: Diagnosis not present

## 2019-07-17 ENCOUNTER — Other Ambulatory Visit: Payer: Self-pay

## 2019-07-17 ENCOUNTER — Ambulatory Visit (INDEPENDENT_AMBULATORY_CARE_PROVIDER_SITE_OTHER): Payer: Medicare Other

## 2019-07-17 DIAGNOSIS — E538 Deficiency of other specified B group vitamins: Secondary | ICD-10-CM | POA: Diagnosis not present

## 2019-07-17 MED ORDER — CYANOCOBALAMIN 1000 MCG/ML IJ SOLN
1000.0000 ug | Freq: Once | INTRAMUSCULAR | Status: AC
  Administered 2019-07-17: 16:00:00 1000 ug via INTRAMUSCULAR

## 2019-07-17 NOTE — Progress Notes (Signed)
Medical screening examination/treatment/procedure(s) were performed by non-physician practitioner and as supervising physician I was immediately available for consultation/collaboration. I agree with above. Jyrah Blye, MD   

## 2019-07-23 ENCOUNTER — Other Ambulatory Visit: Payer: Self-pay

## 2019-07-23 ENCOUNTER — Ambulatory Visit (AMBULATORY_SURGERY_CENTER): Payer: Self-pay | Admitting: *Deleted

## 2019-07-23 ENCOUNTER — Encounter: Payer: Self-pay | Admitting: Internal Medicine

## 2019-07-23 VITALS — Temp 96.8°F | Ht 72.0 in | Wt 194.8 lb

## 2019-07-23 DIAGNOSIS — Z86010 Personal history of colonic polyps: Secondary | ICD-10-CM

## 2019-07-23 MED ORDER — PEG 3350-KCL-NA BICARB-NACL 420 G PO SOLR: 4000.0000 mL | Freq: Once | ORAL | 0 refills | Status: AC

## 2019-07-23 NOTE — Progress Notes (Signed)

## 2019-08-03 ENCOUNTER — Telehealth: Payer: Self-pay | Admitting: Internal Medicine

## 2019-08-03 NOTE — Telephone Encounter (Signed)
[facility] and cannot [facility]- [facility]- [facility] and she [facility] at this [facility]- she is also out of Nulytely- I will do a sample Suprep Lot [id number] exp 05/22 as directed- Pt will pick up on 3rd floor  along with new Suprep instructions- Pt and I discussed over the phone how to use Suprep and he will call if he has questions

## 2019-08-03 NOTE — Telephone Encounter (Signed)
Pt is scheduled for 08/06/19 colon and reported that [facility] does not have prep solution in stock.

## 2019-08-05 ENCOUNTER — Telehealth: Payer: Self-pay | Admitting: Internal Medicine

## 2019-08-05 NOTE — Telephone Encounter (Signed)

## 2019-08-06 ENCOUNTER — Other Ambulatory Visit: Payer: Self-pay

## 2019-08-06 ENCOUNTER — Ambulatory Visit (AMBULATORY_SURGERY_CENTER): Payer: Medicare Other | Admitting: Internal Medicine

## 2019-08-06 ENCOUNTER — Encounter: Payer: Self-pay | Admitting: Internal Medicine

## 2019-08-06 VITALS — BP 97/59 | HR 61 | Temp 97.6°F | Resp 11 | Ht 72.0 in | Wt 194.8 lb

## 2019-08-06 DIAGNOSIS — D123 Benign neoplasm of transverse colon: Secondary | ICD-10-CM | POA: Diagnosis not present

## 2019-08-06 DIAGNOSIS — Z860100 Personal history of colon polyps, unspecified: Secondary | ICD-10-CM

## 2019-08-06 MED ORDER — SODIUM CHLORIDE 0.9 % IV SOLN: 500.0000 mL | Freq: Once | INTRAVENOUS | Status: DC

## 2019-08-06 NOTE — Op Note (Signed)
[facility] Patient Name: Austin [name] Procedure Date: 08/06/2019 10:45 AM MRN: [id number] Endoscopist: Jerene Bears , MD Age: 78 Referring MD:  Date of Birth: 10/03/1941 Gender: Male Account #: [id number] Procedure:                Colonoscopy Indications:              High risk colon cancer surveillance: Personal                            history of adenoma (10 mm or greater in size), Last                            colonoscopy 3 years ago Medicines:                Monitored Anesthesia Care Procedure:                Pre-Anesthesia Assessment:                           - Prior to the procedure, a History and Physical                            was performed, and patient medications and                            allergies were reviewed. The patient's tolerance of                            previous anesthesia was also reviewed. The risks                            and benefits of the procedure and the sedation                            options and risks were discussed with the patient.                            All questions were answered, and informed consent                            was obtained. Prior Anticoagulants: The patient has                            taken no previous anticoagulant or antiplatelet                            agents. ASA Grade Assessment: II - A patient with                            mild systemic disease. After reviewing the risks                            and benefits, the patient was deemed in  satisfactory condition to undergo the procedure.                           After obtaining informed consent, the colonoscope                            was passed under direct vision. Throughout the                            procedure, the patient's blood pressure, pulse, and                            oxygen saturations were monitored continuously. The                            Colonoscope was introduced through the  anus and                            advanced to the cecum, identified by appendiceal                            orifice and ileocecal valve. The colonoscopy was                            performed without difficulty. The patient tolerated                            the procedure well. The quality of the bowel                            preparation was good. The ileocecal valve,                            appendiceal orifice, and rectum were photographed. Scope In: 10:50:24 AM Scope Out: 11:04:09 AM Scope Withdrawal Time: 0 hours 10 minutes 46 seconds  Total Procedure Duration: 0 hours 13 minutes 45 seconds  Findings:                 The digital rectal exam was normal.                           A 5 mm polyp was found in the transverse colon. The                            polyp was sessile. The polyp was removed with a                            cold snare. Resection and retrieval were complete.                           Multiple small and large-mouthed diverticula were                            found in the sigmoid colon, descending colon  and                            splenic flexure.                           Internal hemorrhoids were found during                            retroflexion. The hemorrhoids were medium-sized. Complications:            No immediate complications. Estimated Blood Loss:     Estimated blood loss: none. Impression:               - One 5 mm polyp in the transverse colon, removed                            with a cold snare. Resected and retrieved.                           - Diverticulosis in the sigmoid colon, in the                            descending colon and at the splenic flexure.                           - Internal hemorrhoids. Recommendation:           - Patient has a contact number available for                            emergencies. The signs and symptoms of potential                            delayed complications were discussed with the                             patient. Return to normal activities tomorrow.                            Written discharge instructions were provided to the                            patient.                           - Resume previous diet.                           - Continue present medications.                           - Await pathology results.                           - No repeat colonoscopy due to age at next  screening interval (age at that time >80 years). Jerene Bears, MD 08/06/2019 11:07:41 AM This report has been signed electronically.

## 2019-08-06 NOTE — Patient Instructions (Signed)
HANDOUTS PROVIDED ON: POLYPS, DIVERTICULOSIS, & HEMORRHOIDS  THE POLYPS REMOVED HAVE BEEN SENT TO PATHOLOGY.  THE RESULTS WILL TAKE 2-3 WEEKS TO RECEIVE.   NO FOLLOW UP COLONOSCOPY IS RECOMMENDED.  YOU MAY RESUME YOUR PREVIOUS DIET AND MEDICATION SCHEDULE.  [place] YOU FOR ALLOWING [place] TO CARE FOR YOU TODAY!!  YOU HAD AN ENDOSCOPIC PROCEDURE TODAY AT [facility]:   Refer to the procedure report that was given to you for any specific questions about what was found during the examination.  If the procedure report does not answer your questions, please call your gastroenterologist to clarify.  If you requested that your care partner not be given the details of your procedure findings, then the procedure report has been included in a sealed envelope for you to review at your convenience later.  YOU SHOULD EXPECT: Some feelings of bloating in the abdomen. Passage of more gas than usual.  Walking can help get rid of the air that was put into your GI tract during the procedure and reduce the bloating. If you had a lower endoscopy (such as a colonoscopy or flexible sigmoidoscopy) you may notice spotting of blood in your stool or on the toilet paper. If you underwent a bowel prep for your procedure, you may not have a normal bowel movement for a few days.  Please Note:  You might notice some irritation and congestion in your nose or some drainage.  This is from the oxygen used during your procedure.  There is no need for concern and it should clear up in a day or so.  SYMPTOMS TO REPORT IMMEDIATELY:   Following lower endoscopy (colonoscopy or flexible sigmoidoscopy):  Excessive amounts of blood in the stool  Significant tenderness or worsening of abdominal pains  Swelling of the abdomen that is new, acute  Fever of 100F or higher  For urgent or emergent issues, a gastroenterologist can be reached at any hour by calling [phone].   DIET:  We do recommend a small meal at first,  but then you may proceed to your regular diet.  Drink plenty of fluids but you should avoid alcoholic beverages for 24 hours.  ACTIVITY:  You should plan to take it easy for the rest of today and you should NOT DRIVE or use heavy machinery until tomorrow (because of the sedation medicines used during the test).    FOLLOW UP: Our staff will call the number listed on your records 48-72 hours following your procedure to check on you and address any questions or concerns that you may have regarding the information given to you following your procedure. If we do not reach you, we will leave a message.  We will attempt to reach you two times.  During this call, we will ask if you have developed any symptoms of COVID 19. If you develop any symptoms (ie: fever, flu-like symptoms, shortness of breath, cough etc.) before then, please call [phone].  If you test positive for Covid 19 in the 2 weeks post procedure, please call and report this information to [place].    If any biopsies were taken you will be contacted by phone or by letter within the next 1-3 weeks.  Please call us at [phone] if you have not heard about the biopsies in 3 weeks.    SIGNATURES/CONFIDENTIALITY: You and/or your care partner have signed paperwork which will be entered into your electronic medical record.  These signatures attest to the fact that that the information above  on your After Visit Summary has been reviewed and is understood.  Full responsibility of the confidentiality of this discharge information lies with you and/or your care-partner.

## 2019-08-06 NOTE — Progress Notes (Signed)
Report given to PACU, vss 

## 2019-08-06 NOTE — Progress Notes (Signed)
Called to room to assist during endoscopic procedure.  Patient ID and intended procedure confirmed with present staff. Received instructions for my participation in the procedure from the performing physician.  

## 2019-08-10 ENCOUNTER — Telehealth: Payer: Self-pay

## 2019-08-10 NOTE — Telephone Encounter (Signed)
  Follow up Call-  Call back number 08/06/2019  Post procedure Call Back phone  # [phone]  Permission to leave phone message Yes  Some recent data might be hidden     Left message

## 2019-08-10 NOTE — Telephone Encounter (Signed)
Follow up call attempted.  NALM  

## 2019-08-13 ENCOUNTER — Encounter: Payer: Self-pay | Admitting: Internal Medicine

## 2019-08-19 ENCOUNTER — Ambulatory Visit (INDEPENDENT_AMBULATORY_CARE_PROVIDER_SITE_OTHER): Payer: Medicare Other

## 2019-08-19 ENCOUNTER — Other Ambulatory Visit: Payer: Self-pay

## 2019-08-19 DIAGNOSIS — E538 Deficiency of other specified B group vitamins: Secondary | ICD-10-CM | POA: Diagnosis not present

## 2019-08-19 MED ORDER — CYANOCOBALAMIN 1000 MCG/ML IJ SOLN
1000.0000 ug | Freq: Once | INTRAMUSCULAR | Status: AC
  Administered 2019-08-19: 1000 ug via INTRAMUSCULAR

## 2019-08-19 NOTE — Progress Notes (Signed)
Medical screening examination/treatment/procedure(s) were performed by non-physician practitioner and as supervising physician I was immediately available for consultation/collaboration. I agree with above. Kynlea Blackston, MD   

## 2019-09-17 ENCOUNTER — Ambulatory Visit (INDEPENDENT_AMBULATORY_CARE_PROVIDER_SITE_OTHER): Payer: Medicare Other

## 2019-09-17 ENCOUNTER — Other Ambulatory Visit: Payer: Self-pay

## 2019-09-17 DIAGNOSIS — E538 Deficiency of other specified B group vitamins: Secondary | ICD-10-CM

## 2019-09-17 MED ORDER — CYANOCOBALAMIN 1000 MCG/ML IJ SOLN
1000.0000 ug | Freq: Once | INTRAMUSCULAR | Status: AC
  Administered 2019-09-17: 1000 ug via INTRAMUSCULAR

## 2019-09-17 NOTE — Progress Notes (Signed)
Medical screening examination/treatment/procedure(s) were performed by non-physician practitioner and as supervising physician I was immediately available for consultation/collaboration. I agree with above. James John, MD   

## 2019-10-02 ENCOUNTER — Other Ambulatory Visit: Payer: Self-pay | Admitting: Urology

## 2019-10-02 ENCOUNTER — Other Ambulatory Visit (HOSPITAL_COMMUNITY): Payer: Self-pay | Admitting: Urology

## 2019-10-02 DIAGNOSIS — C61 Malignant neoplasm of prostate: Secondary | ICD-10-CM

## 2019-10-06 ENCOUNTER — Other Ambulatory Visit: Payer: Self-pay | Admitting: Internal Medicine

## 2019-10-06 NOTE — Telephone Encounter (Signed)
No need further high dose Vit D  then plan to change to OTC Vitamin D3 at 2000 units per day, indefinitely.

## 2019-10-06 NOTE — Telephone Encounter (Signed)
Notified pt w/MD response.../lmb 

## 2019-10-21 ENCOUNTER — Other Ambulatory Visit: Payer: Self-pay

## 2019-10-21 ENCOUNTER — Encounter (HOSPITAL_COMMUNITY)
Admission: RE | Admit: 2019-10-21 | Discharge: 2019-10-21 | Disposition: A | Discharge status: Home / Self Care | Payer: Medicare Other | Source: Ambulatory Visit | Attending: Urology | Admitting: Urology

## 2019-10-21 DIAGNOSIS — C61 Malignant neoplasm of prostate: Secondary | ICD-10-CM | POA: Diagnosis present

## 2019-10-21 MED ORDER — TECHNETIUM TC 99M MEDRONATE IV KIT
20.4000 | PACK | Freq: Once | INTRAVENOUS | Status: AC | PRN
  Administered 2019-10-21: 10:00:00 20.4 via INTRAVENOUS

## 2019-10-22 ENCOUNTER — Ambulatory Visit: Payer: Medicare Other

## 2019-10-23 ENCOUNTER — Ambulatory Visit (INDEPENDENT_AMBULATORY_CARE_PROVIDER_SITE_OTHER): Payer: Medicare Other

## 2019-10-23 ENCOUNTER — Other Ambulatory Visit: Payer: Self-pay

## 2019-10-23 DIAGNOSIS — E538 Deficiency of other specified B group vitamins: Secondary | ICD-10-CM

## 2019-10-23 MED ORDER — CYANOCOBALAMIN 1000 MCG/ML IJ SOLN
1000.0000 ug | Freq: Once | INTRAMUSCULAR | Status: AC
  Administered 2019-10-23: 1000 ug via INTRAMUSCULAR

## 2019-10-23 NOTE — Progress Notes (Signed)
Medical screening examination/treatment/procedure(s) were performed by non-physician practitioner and as supervising physician I was immediately available for consultation/collaboration. I agree with above. Demond Shallenberger, MD   

## 2019-10-26 ENCOUNTER — Other Ambulatory Visit (HOSPITAL_COMMUNITY): Payer: Self-pay | Admitting: Urology

## 2019-10-26 ENCOUNTER — Ambulatory Visit (HOSPITAL_COMMUNITY)
Admission: RE | Admit: 2019-10-26 | Discharge: 2019-10-26 | Disposition: A | Discharge status: Home / Self Care | Payer: Medicare Other | Source: Ambulatory Visit | Attending: Urology | Admitting: Urology

## 2019-10-26 ENCOUNTER — Other Ambulatory Visit: Payer: Self-pay

## 2019-10-26 ENCOUNTER — Encounter: Payer: Self-pay | Admitting: Radiation Oncology

## 2019-10-26 ENCOUNTER — Encounter: Payer: Self-pay | Admitting: *Deleted

## 2019-10-26 DIAGNOSIS — C61 Malignant neoplasm of prostate: Secondary | ICD-10-CM

## 2019-10-26 DIAGNOSIS — I10 Essential (primary) hypertension: Secondary | ICD-10-CM | POA: Diagnosis not present

## 2019-10-26 NOTE — Progress Notes (Signed)
GU Location of Tumor / Histology: prostatic adenocarcinoma  If Prostate Cancer, Gleason Score is (4 + 3) and PSA is (5.07). Prostate volume: [place] reports his PCP, Cathlean Cower, has been watching his PSA for years. The patient explains approximately 2 years ago he was referred by Dr. Jenny Reichmann to Dr. Janice Norrie for further evaluation of an elevated PSA. The patient reports Dr. Janice Norrie noted his PSA to be high, gave him antibiotics and when it was rechecked he was told it was nothing to worry about.Patient reports when his PCP noted his PSA to be 5.07 this year he referred him back to [facility].   Biopsies of prostate (if applicable) revealed:   Past/Anticipated interventions by urology, if any: prostate biopsy, bone scan (increased uptake at bilateral posterior 6th ribs), referral for consideration of radiotherapy  Past/Anticipated interventions by medical oncology, if any: no  Weight changes, if any: denies  Bowel/Bladder complaints, if any: IPSS 4. SHIM 25. Denies dysuria, hematuria, urinary leakage or incontinence. Reports occasional urinary urgency and frequency.Reports nocturia x 1. Denies any bowel complaints.    Nausea/Vomiting, if any: no  Pain issues, if any:  denies  SAFETY ISSUES:  Prior radiation? denies  Pacemaker/ICD? denies  Possible current pregnancy? no, male patient  Is the patient on methotrexate? no  Current Complaints / other details:  79 year old male. Married.

## 2019-10-27 ENCOUNTER — Encounter: Payer: Self-pay | Admitting: Radiation Oncology

## 2019-10-27 ENCOUNTER — Ambulatory Visit
Admission: RE | Admit: 2019-10-27 | Discharge: 2019-10-27 | Disposition: A | Discharge status: Home / Self Care | Payer: Medicare Other | Source: Ambulatory Visit | Attending: Radiation Oncology | Admitting: Radiation Oncology

## 2019-10-27 VITALS — Ht 72.0 in | Wt 187.0 lb

## 2019-10-27 DIAGNOSIS — C61 Malignant neoplasm of prostate: Secondary | ICD-10-CM

## 2019-10-27 HISTORY — DX: Malignant neoplasm of prostate: C61

## 2019-10-27 NOTE — Progress Notes (Signed)
Radiation Oncology         [phone] ________________________________  Initial outpatient Consultation - Conducted via Telephone due to current COVID-19 concerns for limiting patient exposure  Name: Austin [name] MRN: [id number]  Date: 10/27/2019  DOB: 12/03/1940  [id number], Hunt Oris, MD  Davis Gourd*   REFERRING PHYSICIAN: Davis Gourd*  DIAGNOSIS: 79 y.o. gentleman with Stage T1c adenocarcinoma of the prostate with Gleason score of 4+3, and PSA of 5.07.    ICD-10-CM   1. Malignant neoplasm of prostate [facility])  C61     HISTORY OF PRESENT ILLNESS: Austin [name] is a 79 y.o. male with a diagnosis of prostate cancer. He reports that he was initially seen by Dr. Janice Norrie in 2018 for an elevated PSA and was successfully treated with antibiotics with PSA returning to normal at that time. More recently, he was noted to have an elevated PSA of 5.07 by his primary care physician, Dr. Jenny Reichmann.  This was increased from 4.04 in March 2019.  Accordingly, he was referred for evaluation in urology by Dr. Lovena Neighbours on 09/03/2019,  digital rectal examination was performed at that time revealing no nodules.  The patient proceeded to transrectal ultrasound with 12 biopsies of the prostate on 09/25/2019.  The prostate volume measured 29 cc.  Out of 12 core biopsies, 4 were positive.  The maximum Gleason score was 4+3, and this was seen in left mid lateral. Gleason 3+4 was seen in left apex lateral and right mid lateral. Gleason 3+3 was seen in left apex.  He underwent bone scan on 10/21/2019, which showed increased uptake at the posterior 6th ribs bilaterally, suspicious for osseous metastases. CT A/P performed the same day showed no findings to suggest solid organ or nodal metastasis and there was no correlate to the bone scan findings.  There was an indeterminate 7 mm right iliac lesion but no correlate on his bone scan.  He did have additional chest x-ray imaging on 10/26/2019 for further evaluation  of the rib lesions and this showed no neoplastic focus or concerns for metastatic disease.  He is scheduled for consult with Dr. Alinda Money on 11/03/2019 to discuss prostatectomy.  The patient reviewed the biopsy results with his urologist and he has kindly been referred today for discussion of potential radiation treatment options.  PREVIOUS RADIATION THERAPY: No  PAST MEDICAL HISTORY:  Past Medical History:  Diagnosis Date  . Allergy   . BPH (benign prostatic hypertrophy)   . Cataract    forming left eye   . History of nephrolithiasis   . Hypertension   . Low back pain   . Prostate cancer ([place])       PAST SURGICAL HISTORY: Past Surgical History:  Procedure Laterality Date  . COLONOSCOPY    . HEMORRHOID SURGERY    . POLYPECTOMY    . PROSTATE BIOPSY      FAMILY HISTORY:  Family History  Problem Relation Age of Onset  . Diabetes Mother   . Liver cancer Father 62  . COPD Other   . Cancer Other        Renal Grandfather  . Heart disease Other        CAD  . Psoriasis Other   . Colon polyps Maternal Uncle   . Esophageal cancer Neg Hx   . Pancreatic cancer Neg Hx   . Prostate cancer Neg Hx   . Rectal cancer Neg Hx   . Stomach cancer Neg Hx   . Colon cancer Neg  Hx     SOCIAL HISTORY:  Social History   Socioeconomic History  . Marital status: Married    Spouse name: Not on file  . Number of children: 2  . Years of education: Not on file  . Highest education level: Not on file  Occupational History  . Occupation: [employer]  Tobacco Use  . Smoking status: Former Smoker    Packs/day: 0.25    Years: 7.00    Pack years: 1.75    Types: Cigarettes    Quit date: 10/15/1964    Years since quitting: 55.0  . Smokeless tobacco: Never Used  . Tobacco comment: quit 40+ yrs ago  Substance and Sexual Activity  . Alcohol use: Yes    Alcohol/week: 0.0 standard drinks    Comment: glass of wine seldom  . Drug use: No  . Sexual activity: Not Currently  Other Topics Concern  . Not  on file  Social History Narrative  . Not on file   Social Determinants of Health   Financial Resource Strain:   . Difficulty of Paying Living Expenses: Not on file  Food Insecurity:   . Worried About Charity fundraiser in the Last Year: Not on file  . Ran Out of Food in the Last Year: Not on file  Transportation Needs:   . Lack of Transportation (Medical): Not on file  . Lack of Transportation (Non-Medical): Not on file  Physical Activity:   . Days of Exercise per Week: Not on file  . Minutes of Exercise per Session: Not on file  Stress:   . Feeling of Stress : Not on file  Social Connections:   . Frequency of Communication with Friends and Family: Not on file  . Frequency of Social Gatherings with Friends and Family: Not on file  . Attends Religious Services: Not on file  . Active Member of Clubs or Organizations: Not on file  . Attends Archivist Meetings: Not on file  . Marital Status: Not on file  Intimate Partner Violence:   . Fear of Current or Ex-Partner: Not on file  . Emotionally Abused: Not on file  . Physically Abused: Not on file  . Sexually Abused: Not on file    ALLERGIES: Iohexol and Ivp dye [iodinated diagnostic agents]  MEDICATIONS:  Current Outpatient Medications  Medication Sig Dispense Refill  . aspirin 81 MG tablet Take 81 mg by mouth daily.      . Cyanocobalamin (VITAMIN B 12 PO) Injections every 30 days    . ramipril (ALTACE) 5 MG capsule TAKE 1 CAPSULE BY MOUTH  DAILY 90 capsule 1  . triamcinolone cream (KENALOG) 0.1 % Apply 1 application topically 2 (two) times daily. 80 g 2  . Vitamin D, Ergocalciferol, (DRISDOL) 1.25 MG (50000 UT) CAPS capsule Take 1 capsule (50,000 Units total) by mouth every 7 (seven) days. 12 capsule 0   No current facility-administered medications for this encounter.    REVIEW OF SYSTEMS:  On review of systems, the patient reports that he is doing well overall. He denies any chest pain, shortness of breath,  cough, fevers, chills, night sweats, unintended weight changes. He denies any bowel disturbances, and denies abdominal pain, nausea or vomiting. He denies any new musculoskeletal or joint aches or pains. His IPSS was 4, indicating mild urinary symptoms. He reports occasional urinary urgency, frequency, and nocturia x1. His SHIM was 25, indicating he does not have erectile dysfunction. A complete review of systems is obtained and is otherwise  negative.    PHYSICAL EXAM:  Wt Readings from Last 3 Encounters:  10/27/19 187 lb (84.8 kg)  08/06/19 194 lb 12.8 oz (88.4 kg)  07/23/19 194 lb 12.8 oz (88.4 kg)   Temp Readings from Last 3 Encounters:  08/06/19 97.6 F (36.4 C)  07/23/19 (!) 96.8 F (36 C) (Temporal)  06/17/19 97.9 F (36.6 C) (Oral)   BP Readings from Last 3 Encounters:  08/06/19 (!) 97/59  06/17/19 124/78  09/23/18 124/82   Pulse Readings from Last 3 Encounters:  08/06/19 61  06/17/19 63  09/23/18 79   Pain Assessment Pain Score: 0-No pain/10  Physical exam not performed in light of telephone consult visit format.  KPS = 90  100 - Normal; no complaints; no evidence of disease. 90   - Able to carry on normal activity; minor signs or symptoms of disease. 80   - Normal activity with effort; some signs or symptoms of disease. 61   - Cares for self; unable to carry on normal activity or to do active work. 60   - Requires occasional assistance, but is able to care for most of his personal needs. 50   - Requires considerable assistance and frequent medical care. 58   - Disabled; requires special care and assistance. 72   - Severely disabled; hospital admission is indicated although death not imminent. 21   - Very sick; hospital admission necessary; active supportive treatment necessary. 10   - Moribund; fatal processes progressing rapidly. 0     - Dead  Karnofsky DA, Abelmann [place], Craver LS and Burchenal [facility] [phone]) The use of the nitrogen mustards in the palliative  treatment of carcinoma: with particular reference to bronchogenic carcinoma Cancer 1 634-56  LABORATORY DATA:  Lab Results  Component Value Date   WBC 4.6 06/17/2019   HGB 13.6 06/17/2019   HCT 39.9 06/17/2019   MCV 92.8 06/17/2019   PLT 164.0 06/17/2019   Lab Results  Component Value Date   NA 139 06/17/2019   K 4.2 06/17/2019   CL 104 06/17/2019   CO2 29 06/17/2019   Lab Results  Component Value Date   ALT 12 06/17/2019   AST 16 06/17/2019   ALKPHOS 53 06/17/2019   BILITOT 0.7 06/17/2019     RADIOGRAPHY: DG Chest 2 View  Result Date: 10/26/2019 CLINICAL DATA:  Prostate carcinoma.  Hypertension. EXAM: CHEST - 2 VIEW COMPARISON:  September 23, 2018 FINDINGS: There is apparent scarring in the right upper lobe. There is no appreciable edema or consolidation. The heart size and pulmonary vascularity are stable and within normal limits. No adenopathy. No evident blastic or lytic bone lesions. IMPRESSION: No neoplastic focus demonstrable. Scarring right upper lobe. No edema or consolidation. Cardiac silhouette within normal limits. No adenopathy. Electronically Signed   By: Lowella Grip III M.D.   On: 10/26/2019 10:17   NM Bone Scan Whole Body  Result Date: 10/21/2019 CLINICAL DATA:  Prostate cancer, PSA 5.07 EXAM: NUCLEAR MEDICINE WHOLE BODY BONE SCAN TECHNIQUE: Whole body anterior and posterior images were obtained approximately 3 hours after intravenous injection of radiopharmaceutical. RADIOPHARMACEUTICALS:  20.4 mCi Technetium-57m MDP IV COMPARISON:  None Correlation: CT abdomen and pelvis 10/21/2019 FINDINGS: Uptake at the shoulders, sternoclavicular joints, mildly at RIGHT hip and RIGHT knee, typically degenerative. Denture artifacts. Subtle foci of abnormal increased tracer accumulation are seen at the posterior sixth ribs bilaterally concerning for osseous metastases. No additional sites of abnormal osseous tracer accumulation. Expected urinary tract and soft tissue  distribution of tracer. IMPRESSION: Increased uptake at the posterior sixth ribs bilaterally suspicious for osseous metastases. Electronically Signed   By: Lavonia Dana M.D.   On: 10/21/2019 15:50      IMPRESSION/PLAN: This visit was conducted via Telephone to spare the patient unnecessary potential exposure in the healthcare setting during the current COVID-19 pandemic. 1. 79 y.o. gentleman with Stage T1c adenocarcinoma of the prostate with Gleason Score of 4+3, and PSA of 5.07. We discussed the patient's workup and outlined the nature of prostate cancer in this setting. The patient's T stage, Gleason's score, and PSA put him into the unfavorable intermediate risk group. Accordingly, he is eligible for a variety of potential treatment options including brachytherapy, 5.5 weeks of external radiation or prostatectomy. We discussed the available radiation techniques, and focused on the details and logistics and delivery. We discussed and outlined the risks, benefits, short and long-term effects associated with radiotherapy and compared and contrasted these with prostatectomy. We also discussed the role of SpaceOAR in reducing the rectal toxicity associated with radiotherapy.   He is scheduled to meet with Dr. Alinda Money on 11/03/2019 to discuss the option of prostatectomy. Following this visit, he plans to reach a final decision. We assured the patient that he can contact our office at any time if he has any additional questions and/or should he elect to proceed with radiotherapy. We will share our discussion with Dr. Lovena Neighbours and with Dr. Alinda Money and  look forward to following along in the care of this very nice gentleman.  Given current concerns for patient exposure during the COVID-19 pandemic, this encounter was conducted via telephone. The patient was notified in advance and was offered a MyChart meeting to allow for face to face communication but unfortunately reported that he did not have the appropriate  resources/technology to support such a visit and instead preferred to proceed with telephone consult. The patient has given verbal consent for this type of encounter. The time spent during this encounter was 50 minutes. The attendants for this meeting include Tyler Pita MD, Freeman Caldron PA-C, [place], patient, Austin [name]. During the encounter, Tyler Pita MD, Ashlyn Bruning PA-C, and scribe, Wilburn Mylar were located at [place].  Patient, Austin [name] was located at home.    Nicholos Johns, PA-C    Tyler Pita, MD  [facility] Direct Dial: [phone]  Fax: [phone] [email or web address]  Skype  [email or web address]  This document serves as a record of services personally performed by Tyler Pita, MD and Freeman Caldron, PA-C. It was created on their behalf by Wilburn Mylar, a trained medical scribe. The creation of this record is based on the scribe's personal observations and the provider's statements to them. This document has been checked and approved by the attending provider.

## 2019-10-27 NOTE — Progress Notes (Signed)
See progress note under physician encounter. 

## 2019-10-30 DIAGNOSIS — C61 Malignant neoplasm of prostate: Secondary | ICD-10-CM | POA: Insufficient documentation

## 2019-11-04 ENCOUNTER — Other Ambulatory Visit: Payer: Self-pay | Admitting: Urology

## 2019-11-04 ENCOUNTER — Other Ambulatory Visit (HOSPITAL_COMMUNITY): Payer: Self-pay | Admitting: Urology

## 2019-11-04 DIAGNOSIS — D49512 Neoplasm of unspecified behavior of left kidney: Secondary | ICD-10-CM

## 2019-11-05 ENCOUNTER — Telehealth: Payer: Self-pay | Admitting: Medical Oncology

## 2019-11-05 NOTE — Telephone Encounter (Signed)
Spoke with patient to introduce myself as the prostate nurse navigator and discuss my role. I was unable to meet him 1/12, when he consulted with Dr. Tammi Klippel. He states the consult went extremely well. He had appointment with Dr. Alinda Money 11/03/19 to discuss prostatectomy. He states he has a few questions for Dr. Lovena Neighbours, before making his final decision. I asked him to reach out to me for questions or concerns. He voiced understanding.

## 2019-11-11 ENCOUNTER — Telehealth: Payer: Self-pay | Admitting: *Deleted

## 2019-11-11 NOTE — Telephone Encounter (Signed)
Called patient to ask questions, spoke with patient 

## 2019-11-12 ENCOUNTER — Encounter (HOSPITAL_COMMUNITY): Payer: Self-pay

## 2019-11-12 ENCOUNTER — Ambulatory Visit (HOSPITAL_COMMUNITY)
Admission: RE | Admit: 2019-11-12 | Discharge: 2019-11-12 | Disposition: A | Discharge status: Home / Self Care | Payer: Medicare Other | Source: Ambulatory Visit | Attending: Urology | Admitting: Urology

## 2019-11-12 ENCOUNTER — Other Ambulatory Visit: Payer: Self-pay

## 2019-11-12 DIAGNOSIS — N2889 Other specified disorders of kidney and ureter: Secondary | ICD-10-CM | POA: Diagnosis not present

## 2019-11-12 DIAGNOSIS — D49512 Neoplasm of unspecified behavior of left kidney: Secondary | ICD-10-CM | POA: Diagnosis not present

## 2019-11-12 LAB — POCT I-STAT CREATININE: Creatinine, Ser: 0.9 mg/dL (ref 0.61–1.24)

## 2019-11-12 MED ORDER — GADOBUTROL 1 MMOL/ML IV SOLN
9.0000 mL | Freq: Once | INTRAVENOUS | Status: AC | PRN
  Administered 2019-11-12: 9 mL via INTRAVENOUS

## 2019-12-01 ENCOUNTER — Other Ambulatory Visit: Payer: Self-pay | Admitting: Urology

## 2019-12-04 ENCOUNTER — Encounter: Payer: Self-pay | Admitting: Medical Oncology

## 2019-12-11 ENCOUNTER — Other Ambulatory Visit: Payer: Self-pay | Admitting: Urology

## 2019-12-11 ENCOUNTER — Telehealth: Payer: Self-pay | Admitting: *Deleted

## 2019-12-11 DIAGNOSIS — C61 Malignant neoplasm of prostate: Secondary | ICD-10-CM

## 2019-12-11 NOTE — Telephone Encounter (Signed)
CALLED PATIENT TO INFORM PRE-SEED APPTS. AND IMPLANT DATE, SPOKE WITH PATIENT AND HE IS AWARE OF THESE APPTS.

## 2019-12-16 ENCOUNTER — Telehealth: Payer: Self-pay | Admitting: *Deleted

## 2019-12-16 NOTE — Telephone Encounter (Signed)
CALLED PATIENT TO REMIND OF PRE-SEED APPTS. FOR 12-17-19, SPOKE WITH PATIENT AND HE IS AWARE OF THESE APPTS.

## 2019-12-17 ENCOUNTER — Other Ambulatory Visit: Payer: Self-pay

## 2019-12-17 ENCOUNTER — Ambulatory Visit
Admission: RE | Admit: 2019-12-17 | Discharge: 2019-12-17 | Disposition: A | Discharge status: Home / Self Care | Payer: Medicare Other | Source: Ambulatory Visit | Attending: Radiation Oncology | Admitting: Radiation Oncology

## 2019-12-17 ENCOUNTER — Encounter (HOSPITAL_COMMUNITY)
Admission: RE | Admit: 2019-12-17 | Discharge: 2019-12-17 | Disposition: A | Discharge status: Home / Self Care | Payer: Medicare Other | Source: Ambulatory Visit | Attending: Urology | Admitting: Urology

## 2019-12-17 ENCOUNTER — Ambulatory Visit (HOSPITAL_COMMUNITY)
Admission: RE | Admit: 2019-12-17 | Discharge: 2019-12-17 | Disposition: A | Discharge status: Home / Self Care | Payer: Medicare Other | Source: Ambulatory Visit | Attending: Urology | Admitting: Urology

## 2019-12-17 ENCOUNTER — Encounter: Payer: Self-pay | Admitting: Medical Oncology

## 2019-12-17 ENCOUNTER — Ambulatory Visit
Admission: RE | Admit: 2019-12-17 | Discharge: 2019-12-17 | Disposition: A | Discharge status: Home / Self Care | Payer: Medicare Other | Source: Ambulatory Visit | Attending: Urology | Admitting: Urology

## 2019-12-17 DIAGNOSIS — C61 Malignant neoplasm of prostate: Secondary | ICD-10-CM | POA: Insufficient documentation

## 2019-12-17 DIAGNOSIS — Z01818 Encounter for other preprocedural examination: Secondary | ICD-10-CM | POA: Diagnosis not present

## 2019-12-17 NOTE — Progress Notes (Signed)
  Radiation Oncology         [phone] ________________________________  Name: REYNOLDS FRANCHINA MRN: [id number]  Date: 12/17/2019  DOB: [birth year]  SIMULATION AND TREATMENT PLANNING NOTE PUBIC ARCH STUDY  [id number], Hunt Oris, MD  Biagio Borg, MD  DIAGNOSIS: 79 y.o. gentleman with Stage T1c adenocarcinoma of the prostate with Gleason score of 4+3, and PSA of 5.07     ICD-10-CM   1. Malignant neoplasm of prostate ([place])  C61     COMPLEX SIMULATION:  The patient presented today for evaluation for possible prostate seed implant. He was brought to the radiation planning suite and placed supine on the CT couch. A 3-dimensional image study set was obtained in upload to the planning computer. There, on each axial slice, I contoured the prostate gland. Then, using three-dimensional radiation planning tools I reconstructed the prostate in view of the structures from the transperineal needle pathway to assess for possible pubic arch interference. In doing so, I did not appreciate any pubic arch interference. Also, the patient's prostate volume was estimated based on the drawn structure. The volume was 29 cc.  Given the pubic arch appearance and prostate volume, patient remains a good candidate to proceed with prostate seed implant. Today, he freely provided informed written consent to proceed.    PLAN: The patient will undergo prostate seed implant.   ________________________________  Sheral Apley. Tammi Klippel, M.D.

## 2020-01-12 ENCOUNTER — Other Ambulatory Visit: Payer: Self-pay

## 2020-01-12 ENCOUNTER — Encounter (HOSPITAL_BASED_OUTPATIENT_CLINIC_OR_DEPARTMENT_OTHER): Payer: Self-pay | Admitting: Urology

## 2020-01-12 NOTE — Progress Notes (Signed)
Spoke w/ via phone for pre-op interview---Austin [name] needs dos---- has [name] appt for cbc, cmet, pt, ptt 01-18-2020 at 830 am             results------chest xray 12-17-2019 epic, ekg 12-17-2019 epic COVID test ------01-18-2020 930 Arrive at -------1045 01-20-2020 NPO after ------midnight Medications to take morning of surgery -----none Diabetic medication -----n/a Patient Special Instructions -----stop 81 mg aspirin per dr Austin [name] instructions Pre-Op special Istructions -----fleets enema am of surgery Patient verbalized understanding of instructions that were given at this phone interview. Patient denies shortness of breath, chest pain, fever, cough a this phone interview.

## 2020-01-13 ENCOUNTER — Telehealth: Payer: Self-pay | Admitting: *Deleted

## 2020-01-13 NOTE — Telephone Encounter (Signed)
CALLED PATIENT TO REMIND OF LAB AND COVID TESTING FOR 01-18-20, SPOKE WITH PATIENT AND HE IS AWARE OF THESE APPTS.

## 2020-01-18 ENCOUNTER — Other Ambulatory Visit (HOSPITAL_COMMUNITY)
Admission: RE | Admit: 2020-01-18 | Discharge: 2020-01-18 | Disposition: A | Discharge status: Home / Self Care | Payer: Medicare Other | Source: Ambulatory Visit | Attending: Urology | Admitting: Urology

## 2020-01-18 ENCOUNTER — Other Ambulatory Visit: Payer: Self-pay

## 2020-01-18 ENCOUNTER — Encounter (HOSPITAL_COMMUNITY)
Admission: RE | Admit: 2020-01-18 | Discharge: 2020-01-18 | Disposition: A | Discharge status: Home / Self Care | Payer: Medicare Other | Source: Ambulatory Visit | Attending: Urology | Admitting: Urology

## 2020-01-18 DIAGNOSIS — Z01812 Encounter for preprocedural laboratory examination: Secondary | ICD-10-CM | POA: Insufficient documentation

## 2020-01-18 DIAGNOSIS — Z20822 Contact with and (suspected) exposure to covid-19: Secondary | ICD-10-CM | POA: Insufficient documentation

## 2020-01-18 LAB — COMPREHENSIVE METABOLIC PANEL
ALT: 21 U/L (ref 0–44)
AST: 22 U/L (ref 15–41)
Albumin: 4.1 g/dL (ref 3.5–5.0)
Alkaline Phosphatase: 50 U/L (ref 38–126)
Anion gap: 6 (ref 5–15)
BUN: 17 mg/dL (ref 8–23)
CO2: 27 mmol/L (ref 22–32)
Calcium: 9 mg/dL (ref 8.9–10.3)
Chloride: 108 mmol/L (ref 98–111)
Creatinine, Ser: 0.93 mg/dL (ref 0.61–1.24)
GFR calc Af Amer: >60 mL/min (ref >60)
GFR calc non Af Amer: >60 mL/min (ref >60)
Glucose, Bld: 103 mg/dL — ABNORMAL HIGH (ref 70–99)
Potassium: 4.6 mmol/L (ref 3.5–5.1)
Sodium: 141 mmol/L (ref 135–145)
Total Bilirubin: 1 mg/dL (ref 0.3–1.2)
Total Protein: 6.7 g/dL (ref 6.5–8.1)

## 2020-01-18 LAB — APTT: aPTT: 28 seconds (ref 24–36)

## 2020-01-18 LAB — PROTIME-INR
INR: 1 (ref 0.8–1.2)
Prothrombin Time: 13.3 seconds (ref 11.4–15.2)

## 2020-01-18 LAB — CBC
HCT: 40.6 % (ref 39.0–52.0)
Hemoglobin: 13.2 g/dL (ref 13.0–17.0)
MCH: 31.1 pg (ref 26.0–34.0)
MCHC: 32.5 g/dL (ref 30.0–36.0)
MCV: 95.5 fL (ref 80.0–100.0)
Platelets: 165 10*3/uL (ref 150–400)
RBC: 4.25 MIL/uL (ref 4.22–5.81)
RDW: 12.7 % (ref 11.5–15.5)
WBC: 4.8 10*3/uL (ref 4.0–10.5)
nRBC: 0 % (ref 0.0–0.2)

## 2020-01-18 LAB — SARS CORONAVIRUS 2 (TAT 6-24 HRS): SARS Coronavirus 2: NEGATIVE

## 2020-01-19 ENCOUNTER — Telehealth: Payer: Self-pay | Admitting: *Deleted

## 2020-01-19 NOTE — Anesthesia Preprocedure Evaluation (Addendum)
Anesthesia Evaluation  Patient identified by MRN, date of birth, ID band Patient awake    Reviewed: Allergy & Precautions, NPO status , Patient's Chart, lab work & pertinent test results  History of Anesthesia Complications Negative for: history of anesthetic complications  Airway Mallampati: II       Dental  (+) Teeth Intact, Caps,    Pulmonary neg pulmonary ROS, former smoker,    Pulmonary exam normal        Cardiovascular hypertension, Pt. on medications Normal cardiovascular exam     Neuro/Psych negative neurological ROS  negative psych ROS   GI/Hepatic negative GI ROS, Neg liver ROS,   Endo/Other  negative endocrine ROS  Renal/GU negative Renal ROS   Prostate ca negative genitourinary   Musculoskeletal negative musculoskeletal ROS (+)   Abdominal   Peds negative pediatric ROS (+)  Hematology negative hematology ROS (+)   Anesthesia Other Findings Day of surgery medications reviewed with patient.  Reproductive/Obstetrics negative OB ROS                         Anesthesia Physical Anesthesia Plan  ASA: II  Anesthesia Plan: General   Post-op Pain Management:    Induction: Intravenous  PONV Risk Score and Plan: 2 and Treatment may vary due to age or medical condition, Ondansetron and Dexamethasone  Airway Management Planned: LMA  Additional Equipment: None  Intra-op Plan:   Post-operative Plan: Extubation in OR  Informed Consent: I have reviewed the patients History and Physical, chart, labs and discussed the procedure including the risks, benefits and alternatives for the proposed anesthesia with the patient or authorized representative who has indicated his/her understanding and acceptance.       Plan Discussed with: Anesthesiologist  Anesthesia Plan Comments:       Anesthesia Quick Evaluation

## 2020-01-19 NOTE — Telephone Encounter (Signed)
Called patient to remind of implant for 01-20-20, spoke with patient and he is aware of this procedure

## 2020-01-20 ENCOUNTER — Encounter (HOSPITAL_BASED_OUTPATIENT_CLINIC_OR_DEPARTMENT_OTHER): Payer: Self-pay | Admitting: Urology

## 2020-01-20 ENCOUNTER — Ambulatory Visit (HOSPITAL_BASED_OUTPATIENT_CLINIC_OR_DEPARTMENT_OTHER): Payer: Medicare Other | Admitting: Anesthesiology

## 2020-01-20 ENCOUNTER — Ambulatory Visit (HOSPITAL_COMMUNITY): Payer: Medicare Other

## 2020-01-20 ENCOUNTER — Ambulatory Visit (HOSPITAL_BASED_OUTPATIENT_CLINIC_OR_DEPARTMENT_OTHER)
Admission: RE | Admit: 2020-01-20 | Discharge: 2020-01-20 | Disposition: A | Discharge status: Home / Self Care | Payer: Medicare Other | Source: Other Acute Inpatient Hospital | Attending: Urology | Admitting: Urology

## 2020-01-20 ENCOUNTER — Encounter (HOSPITAL_BASED_OUTPATIENT_CLINIC_OR_DEPARTMENT_OTHER)
Admission: RE | Disposition: A | Discharge status: Home / Self Care | Payer: Self-pay | Source: Other Acute Inpatient Hospital | Attending: Urology

## 2020-01-20 ENCOUNTER — Ambulatory Visit (HOSPITAL_BASED_OUTPATIENT_CLINIC_OR_DEPARTMENT_OTHER): Payer: Medicare Other | Admitting: Physician Assistant

## 2020-01-20 ENCOUNTER — Other Ambulatory Visit: Payer: Self-pay

## 2020-01-20 DIAGNOSIS — Z87891 Personal history of nicotine dependence: Secondary | ICD-10-CM | POA: Insufficient documentation

## 2020-01-20 DIAGNOSIS — N320 Bladder-neck obstruction: Secondary | ICD-10-CM | POA: Diagnosis not present

## 2020-01-20 DIAGNOSIS — I10 Essential (primary) hypertension: Secondary | ICD-10-CM | POA: Diagnosis not present

## 2020-01-20 DIAGNOSIS — C61 Malignant neoplasm of prostate: Secondary | ICD-10-CM | POA: Insufficient documentation

## 2020-01-20 HISTORY — PX: RADIOACTIVE SEED IMPLANT: SHX5150

## 2020-01-20 HISTORY — DX: Personal history of urinary calculi: Z87.442

## 2020-01-20 HISTORY — PX: SPACE OAR INSTILLATION: SHX6769

## 2020-01-20 SURGERY — INSERTION, RADIATION SOURCE, PROSTATE
Anesthesia: General | Site: Prostate

## 2020-01-20 MED ORDER — SODIUM CHLORIDE 0.9 % IV SOLN: INTRAVENOUS | Status: DC | PRN

## 2020-01-20 MED ORDER — FENTANYL CITRATE (PF) 100 MCG/2ML IJ SOLN
INTRAMUSCULAR | Status: DC | PRN
  Administered 2020-01-20 (×2): 25 ug via INTRAVENOUS
  Administered 2020-01-20: 50 ug via INTRAVENOUS

## 2020-01-20 MED ORDER — OXYCODONE HCL 5 MG/5ML PO SOLN
5.0000 mg | Freq: Once | ORAL | Status: DC | PRN
  Filled 2020-01-20: qty 5

## 2020-01-20 MED ORDER — TRAMADOL HCL 50 MG PO TABS: 50.0000 mg | ORAL_TABLET | Freq: Four times a day (QID) | ORAL | 0 refills | Status: AC | PRN

## 2020-01-20 MED ORDER — LIDOCAINE 2% (20 MG/ML) 5 ML SYRINGE
INTRAMUSCULAR | Status: DC | PRN
  Administered 2020-01-20: 100 mg via INTRAVENOUS

## 2020-01-20 MED ORDER — ACETAMINOPHEN 500 MG PO TABS
1000.0000 mg | ORAL_TABLET | Freq: Once | ORAL | Status: AC
  Administered 2020-01-20: 1000 mg via ORAL
  Filled 2020-01-20: qty 2

## 2020-01-20 MED ORDER — FLEET ENEMA 7-19 GM/118ML RE ENEM
1.0000 | ENEMA | Freq: Once | RECTAL | Status: DC
  Filled 2020-01-20: qty 1

## 2020-01-20 MED ORDER — PROPOFOL 10 MG/ML IV BOLUS
INTRAVENOUS | Status: DC | PRN
  Administered 2020-01-20: 180 mg via INTRAVENOUS

## 2020-01-20 MED ORDER — MIDAZOLAM HCL 2 MG/2ML IJ SOLN
INTRAMUSCULAR | Status: DC | PRN
  Administered 2020-01-20: 1 mg via INTRAVENOUS

## 2020-01-20 MED ORDER — OXYBUTYNIN CHLORIDE 5 MG PO TABS: 5.0000 mg | ORAL_TABLET | Freq: Three times a day (TID) | ORAL | 1 refills | Status: DC | PRN

## 2020-01-20 MED ORDER — PHENAZOPYRIDINE HCL 200 MG PO TABS: 200.0000 mg | ORAL_TABLET | Freq: Three times a day (TID) | ORAL | 0 refills | Status: DC | PRN

## 2020-01-20 MED ORDER — DEXAMETHASONE SODIUM PHOSPHATE 10 MG/ML IJ SOLN
INTRAMUSCULAR | Status: DC | PRN
  Administered 2020-01-20: 5 mg via INTRAVENOUS

## 2020-01-20 MED ORDER — DEXAMETHASONE SODIUM PHOSPHATE 10 MG/ML IJ SOLN
INTRAMUSCULAR | Status: AC
  Filled 2020-01-20: qty 1

## 2020-01-20 MED ORDER — CIPROFLOXACIN IN D5W 400 MG/200ML IV SOLN
400.0000 mg | INTRAVENOUS | Status: AC
  Administered 2020-01-20: 400 mg via INTRAVENOUS
  Filled 2020-01-20: qty 200

## 2020-01-20 MED ORDER — FENTANYL CITRATE (PF) 100 MCG/2ML IJ SOLN
INTRAMUSCULAR | Status: AC
  Filled 2020-01-20: qty 2

## 2020-01-20 MED ORDER — MIDAZOLAM HCL 2 MG/2ML IJ SOLN
INTRAMUSCULAR | Status: AC
  Filled 2020-01-20: qty 2

## 2020-01-20 MED ORDER — SODIUM CHLORIDE (PF) 0.9 % IJ SOLN
INTRAMUSCULAR | Status: DC | PRN
  Administered 2020-01-20: 3 mL via INTRAVENOUS

## 2020-01-20 MED ORDER — ACETAMINOPHEN 500 MG PO TABS
ORAL_TABLET | ORAL | Status: AC
  Filled 2020-01-20: qty 2

## 2020-01-20 MED ORDER — FENTANYL CITRATE (PF) 100 MCG/2ML IJ SOLN
25.0000 ug | INTRAMUSCULAR | Status: DC | PRN
  Filled 2020-01-20: qty 1

## 2020-01-20 MED ORDER — KETOROLAC TROMETHAMINE 30 MG/ML IJ SOLN
INTRAMUSCULAR | Status: AC
  Filled 2020-01-20: qty 1

## 2020-01-20 MED ORDER — LIDOCAINE 2% (20 MG/ML) 5 ML SYRINGE
INTRAMUSCULAR | Status: AC
  Filled 2020-01-20: qty 5

## 2020-01-20 MED ORDER — IOHEXOL 300 MG/ML  SOLN
INTRAMUSCULAR | Status: DC | PRN
  Administered 2020-01-20: 7 mL

## 2020-01-20 MED ORDER — PROMETHAZINE HCL 25 MG/ML IJ SOLN
6.2500 mg | INTRAMUSCULAR | Status: DC | PRN
  Filled 2020-01-20: qty 1

## 2020-01-20 MED ORDER — ONDANSETRON HCL 4 MG/2ML IJ SOLN
INTRAMUSCULAR | Status: AC
  Filled 2020-01-20: qty 2

## 2020-01-20 MED ORDER — PROPOFOL 10 MG/ML IV BOLUS
INTRAVENOUS | Status: AC
  Filled 2020-01-20: qty 20

## 2020-01-20 MED ORDER — STERILE WATER FOR IRRIGATION IR SOLN
Status: DC | PRN
  Administered 2020-01-20: 1000 mL

## 2020-01-20 MED ORDER — CIPROFLOXACIN IN D5W 400 MG/200ML IV SOLN
INTRAVENOUS | Status: AC
  Filled 2020-01-20: qty 200

## 2020-01-20 MED ORDER — ONDANSETRON HCL 4 MG/2ML IJ SOLN
INTRAMUSCULAR | Status: DC | PRN
  Administered 2020-01-20: 4 mg via INTRAVENOUS

## 2020-01-20 MED ORDER — KETOROLAC TROMETHAMINE 30 MG/ML IJ SOLN
INTRAMUSCULAR | Status: DC | PRN
  Administered 2020-01-20: 30 mg via INTRAVENOUS

## 2020-01-20 MED ORDER — OXYCODONE HCL 5 MG PO TABS
5.0000 mg | ORAL_TABLET | Freq: Once | ORAL | Status: DC | PRN
  Filled 2020-01-20: qty 1

## 2020-01-20 MED ORDER — LACTATED RINGERS IV SOLN
INTRAVENOUS | Status: DC
  Filled 2020-01-20 (×2): qty 1000

## 2020-01-20 SURGICAL SUPPLY — 36 items
BAG DRN RND TRDRP ANRFLXCHMBR (UROLOGICAL SUPPLIES) ×1
BAG URINE DRAIN 2000ML AR STRL (UROLOGICAL SUPPLIES) ×3 IMPLANT
BLADE CLIPPER SENSICLIP SURGIC (BLADE) ×3 IMPLANT
CATH FOLEY 2WAY SLVR  5CC 16FR (CATHETERS) ×3
CATH FOLEY 2WAY SLVR 5CC 16FR (CATHETERS) ×1 IMPLANT
CATH ROBINSON RED A/P 16FR (CATHETERS) IMPLANT
CATH ROBINSON RED A/P 20FR (CATHETERS) ×3 IMPLANT
CLOTH BEACON ORANGE TIMEOUT ST (SAFETY) ×3 IMPLANT
CNTNR URN SCR LID CUP LEK RST (MISCELLANEOUS) ×2 IMPLANT
CONT SPEC 4OZ STRL OR WHT (MISCELLANEOUS) ×6
COVER BACK TABLE 60X90IN (DRAPES) ×3 IMPLANT
COVER MAYO STAND STRL (DRAPES) ×3 IMPLANT
DRSG TEGADERM 4X4.75 (GAUZE/BANDAGES/DRESSINGS) ×4 IMPLANT
DRSG TEGADERM 8X12 (GAUZE/BANDAGES/DRESSINGS) ×4 IMPLANT
GAUZE SPONGE 4X4 12PLY STRL (GAUZE/BANDAGES/DRESSINGS) ×2 IMPLANT
GLOVE BIO SURGEON STRL SZ7.5 (GLOVE) ×3 IMPLANT
GLOVE BIO SURGEON STRL SZ8 (GLOVE) IMPLANT
GLOVE SURG ORTHO 8.5 STRL (GLOVE) ×3 IMPLANT
GLOVE SURG SS PI 6.5 STRL IVOR (GLOVE) IMPLANT
GOWN STRL REUS W/TWL LRG LVL3 (GOWN DISPOSABLE) ×3 IMPLANT
GOWN STRL REUS W/TWL XL LVL3 (GOWN DISPOSABLE) ×3 IMPLANT
HOLDER FOLEY CATH W/STRAP (MISCELLANEOUS) IMPLANT
I-SEED AGX100 ×152 IMPLANT
IMPL SPACEOAR SYSTEM 10ML (Spacer) ×1 IMPLANT
IMPLANT SPACEOAR SYSTEM 10ML (Spacer) ×3 IMPLANT
IV NS 1000ML (IV SOLUTION) ×3
IV NS 1000ML BAXH (IV SOLUTION) ×1 IMPLANT
KIT TURNOVER CYSTO (KITS) ×3 IMPLANT
MARKER SKIN DUAL TIP RULER LAB (MISCELLANEOUS) ×3 IMPLANT
PACK CYSTO (CUSTOM PROCEDURE TRAY) ×3 IMPLANT
SURGILUBE 2OZ TUBE FLIPTOP (MISCELLANEOUS) ×3 IMPLANT
SUT BONE WAX W31G (SUTURE) IMPLANT
SYR 10ML LL (SYRINGE) ×5 IMPLANT
TOWEL OR 17X26 10 PK STRL BLUE (TOWEL DISPOSABLE) ×3 IMPLANT
UNDERPAD 30X30 (UNDERPADS AND DIAPERS) ×6 IMPLANT
WATER STERILE IRR 500ML POUR (IV SOLUTION) ×3 IMPLANT

## 2020-01-20 NOTE — Anesthesia Postprocedure Evaluation (Signed)
Anesthesia Post Note  Patient: Austin [name] [facility]  Procedure(s) Performed: RADIOACTIVE SEED IMPLANT/BRACHYTHERAPY IMPLANT (N/A Prostate) SPACE OAR INSTILLATION (N/A Prostate)     Patient location during evaluation: PACU Anesthesia Type: General Level of consciousness: awake and alert and oriented Pain management: pain level controlled Vital Signs Assessment: post-procedure vital signs reviewed and stable Respiratory status: spontaneous breathing, nonlabored ventilation and respiratory function stable Cardiovascular status: blood pressure returned to baseline Postop Assessment: no apparent nausea or vomiting Anesthetic complications: no    Last Vitals:  Vitals:   01/20/20 1500 01/20/20 1515  BP: 137/73 140/73  Pulse: (!) 54 (!) 57  Resp: 10 14  Temp:    SpO2: 100% 100%    Last Pain:  Vitals:   01/20/20 1409  TempSrc:   PainSc: 0-No pain                 Brennan Bailey

## 2020-01-20 NOTE — Discharge Instructions (Signed)
  Do not take any Tylenol until after 5:15 pm today. Do not take any nonsteroidal anti inflammatories until after 8:00 pm today.  Post Anesthesia Home Care Instructions  Activity: Get plenty of rest for the remainder of the day. A responsible individual must stay with you for 24 hours following the procedure.  For the next 24 hours, DO NOT: -Drive a car -Paediatric nurse -Drink alcoholic beverages -Take any medication unless instructed by your physician -Make any legal decisions or sign important papers.  Meals: Start with liquid foods such as gelatin or soup. Progress to regular foods as tolerated. Avoid greasy, spicy, heavy foods. If nausea and/or vomiting occur, drink only clear liquids until the nausea and/or vomiting subsides. Call your physician if vomiting continues.  Special Instructions/Symptoms: Your throat may feel dry or sore from the anesthesia or the breathing tube placed in your throat during surgery. If this causes discomfort, gargle with warm salt water. The discomfort should disappear within 24 hours.  If you had a scopolamine patch placed behind your ear for the management of post- operative nausea and/or vomiting:  1. The medication in the patch is effective for 72 hours, after which it should be removed.  Wrap patch in a tissue and discard in the trash. Wash hands thoroughly with soap and water. 2. You may remove the patch earlier than 72 hours if you experience unpleasant side effects which may include dry mouth, dizziness or visual disturbances. 3. Avoid touching the patch. Wash your hands with soap and water after contact with the patch.

## 2020-01-20 NOTE — Op Note (Signed)
PATIENT:  [name]  PRE-OPERATIVE DIAGNOSIS:  Adenocarcinoma of the prostate  POST-OPERATIVE DIAGNOSIS:  Same  PROCEDURE:  1. I-125 radioactive seed implantation 2. Cystoscopy  3. Placement of SpaceOAR  SURGEON:  Ellison Hughs, MD  Radiation oncologist: Tyler Pita, MD  ANESTHESIA:  General  EBL:  Minimal  DRAINS: None  INDICATION: Austin [name] is a 79 y.o. male who was noted to have an elevated PSA of 5.07 prompting a TRUS biopsy of the prostate on 09/25/19. This demonstrated Gleason 4+3=7 adenocarcinoma with 4 out of 12 biopsy cores positive for malignancy. After discussing his treatment options, which include surgical removal of the prostate vs radiation therapy, the patient has elected to proceed with brachytherapy seed implantation.  The risk, benefits and alternatives of the above procedures was discussed in detail.  He voices understanding and wishes to proceed.  Description of procedure: After informed consent the patient was brought to the major OR, placed on the table and administered general anesthesia. He was then moved to the modified lithotomy position with his perineum perpendicular to the floor. His perineum and genitalia were then sterilely prepped. An official timeout was then performed. A 16 French Foley catheter was then placed in the bladder and filled with dilute contrast, a rectal tube was placed in the rectum and the transrectal ultrasound probe was placed in the rectum and affixed to the stand. He was then sterilely draped.  Real time ultrasonography was used along with the seed planning software. This was used to develop the seed plan including the number of needles as well as number of seeds required for complete and adequate coverage. Real-time ultrasonography was then used along with the previously developed plan and the Nucletron device to implant a total of 76 seeds using 25 needles. This proceeded without difficulty or complication.   I  then proceeded with placement of SpaceOAR by introducing a needle with the bevel angled inferiorly approximately 2 cm superior to the anus. This was angled downward and under direct ultrasound was placed within the space between the prostatic capsule and rectum. This was confirmed with a small amount of sterile saline injected and this was performed under direct ultrasound. I then attached the SpaceOAR to the needle and injected this in the space between the prostate and rectum with good placement noted.  A Foley catheter was then removed as well as the transrectal ultrasound probe and rectal probe. Flexible cystoscopy was then performed using the 16 French flexible scope which revealed a normal urethra throughout its length down to the sphincter which appeared intact. The prostatic urethra revealed bilobar hypertrophy but no evidence of obstruction, seeds, spacers or lesions. The bladder was then entered and fully and systematically inspected. The ureteral orifices were noted to be of normal configuration and position. The mucosa revealed no evidence of tumors. There were also no stones identified within the bladder. I noted no seeds or spacers on the floor of the bladder and retroflexion of the scope revealed no seeds protruding from the base of the prostate.  The cystoscope was then removed and the patient was awakened and taken to recovery room in stable and satisfactory condition. He tolerated procedure well and there were no intraoperative complications.  Plan:  Follow up on 02/19/20 for a post-op check-up.

## 2020-01-20 NOTE — Transfer of Care (Signed)
Immediate Anesthesia Transfer of Care Note  Patient: Treydan Madeja [facility]  Procedure(s) Performed: RADIOACTIVE SEED IMPLANT/BRACHYTHERAPY IMPLANT (N/A Prostate) SPACE OAR INSTILLATION (N/A Prostate)  Patient Location: PACU  Anesthesia Type:General  Level of Consciousness: awake, alert , oriented and patient cooperative  Airway & Oxygen Therapy: Patient Spontanous Breathing and Patient connected to nasal cannula oxygen  Post-op Assessment: Report given to RN and Post -op Vital signs reviewed and stable  Post vital signs: Reviewed and stable  Last Vitals:  Vitals Value Taken Time  BP 139/80 01/20/20 1409  Temp    Pulse 68 01/20/20 1409  Resp 10 01/20/20 1409  SpO2 96 % 01/20/20 1409  Vitals shown include unvalidated device data.  Last Pain:  Vitals:   01/20/20 1109  TempSrc: Oral  PainSc: 0-No pain      Patients Stated Pain Goal: 5 ([id number])  Complications: No apparent anesthesia complications

## 2020-01-20 NOTE — Anesthesia Procedure Notes (Signed)
Procedure Name: LMA Insertion Date/Time: 01/20/2020 12:45 PM Performed by: Wanita Chamberlain, CRNA Pre-anesthesia Checklist: Patient identified, Emergency Drugs available, Suction available, Patient being monitored and Timeout performed Patient Re-evaluated:Patient Re-evaluated prior to induction Oxygen Delivery Method: Circle system utilized Preoxygenation: Pre-oxygenation with 100% oxygen Induction Type: IV induction Ventilation: Mask ventilation without difficulty LMA: LMA inserted LMA Size: 4.0 Number of attempts: 1 Airway Equipment and Method: Bite block Placement Confirmation: breath sounds checked- equal and bilateral,  CO2 detector and positive ETCO2 Tube secured with: Tape Dental Injury: Teeth and Oropharynx as per pre-operative assessment

## 2020-01-20 NOTE — H&P (Signed)
. Urology Preoperative H&P   Chief Complaint: Prostate cancer  History of Present Illness: Austin [name] is a 79 y.o. male who was noted to have an elevated PSA of 5.07 prompting a TRUS biopsy of the prostate on 09/25/19. This demonstrated Gleason 4+3=7 adenocarcinoma with 4 out of 12 biopsy cores positive for malignancy. After discussing his treatment options, which include surgical removal of the prostate vs radiation therapy, the patient has elected to proceed with brachytherapy seed implantation.   He reports that he is urinating with well minimal LUTS and denies interval UTIs, dysuria or hematuria.     Past Medical History:  Diagnosis Date  . Allergy   . Cataract    forming left eye   . History of kidney stones   . History of nephrolithiasis   . Hypertension   . Low back pain   . Prostate cancer [facility])     Past Surgical History:  Procedure Laterality Date  . COLONOSCOPY    . HEMORRHOID SURGERY  age 53  . POLYPECTOMY    . PROSTATE BIOPSY      Allergies:  Allergies  Allergen Reactions  . Iohexol Hives  . Ivp Dye [Iodinated Diagnostic Agents] Hives    Family History  Problem Relation Age of Onset  . Diabetes Mother   . Liver cancer Father 76  . COPD Other   . Cancer Other        Renal Grandfather  . Heart disease Other        CAD  . Psoriasis Other   . Colon polyps Maternal Uncle   . Esophageal cancer Neg Hx   . Pancreatic cancer Neg Hx   . Prostate cancer Neg Hx   . Rectal cancer Neg Hx   . Stomach cancer Neg Hx   . Colon cancer Neg Hx     Social History:  reports that he quit smoking about 55 years ago. His smoking use included cigarettes. He has a 1.75 pack-year smoking history. He has never used smokeless tobacco. He reports current alcohol use. He reports that he does not use drugs.  ROS: A complete review of systems was performed.  All systems are negative except for pertinent findings as noted.  Physical Exam:  Vital signs in last 24 hours:    Constitutional:  Alert and oriented, No acute distress Cardiovascular: Regular rate and rhythm, No JVD Respiratory: Normal respiratory effort, Lungs clear bilaterally GI: Abdomen is soft, nontender, nondistended, no abdominal masses GU: No CVA tenderness Lymphatic: No lymphadenopathy Neurologic: Grossly intact, no focal deficits Psychiatric: Normal mood and affect  Laboratory Data:  Recent Labs    01/18/20 0803  WBC 4.8  HGB 13.2  HCT 40.6  PLT 165    Recent Labs    01/18/20 0803  NA 141  K 4.6  CL 108  GLUCOSE 103*  BUN 17  CALCIUM 9.0  CREATININE 0.93     No results found for this or any previous visit (from the past 24 hour(s)). Recent Results (from the past 240 hour(s))  SARS CORONAVIRUS 2 (TAT 6-24 HRS) Nasopharyngeal Nasopharyngeal Swab     Status: None   Collection Time: 01/18/20  8:14 AM   Specimen: Nasopharyngeal Swab  Result Value Ref Range Status   SARS Coronavirus 2 NEGATIVE NEGATIVE Final    Comment: (NOTE) SARS-CoV-2 target nucleic acids are NOT DETECTED. The SARS-CoV-2 RNA is generally detectable in upper and lower respiratory specimens during the acute phase of infection. Negative results do not preclude  SARS-CoV-2 infection, do not rule out co-infections with other pathogens, and should not be used as the sole basis for treatment or other patient management decisions. Negative results must be combined with clinical observations, patient history, and epidemiological information. The expected result is Negative. Fact Sheet for Patients: SugarRoll.be Fact Sheet for Healthcare Providers: https://www.woods-mathews.com/ This test is not yet approved or cleared by the [place] FDA and  has been authorized for detection and/or diagnosis of SARS-CoV-2 by FDA under an Emergency Use Authorization (EUA). This EUA will remain  in effect (meaning this test can be used) for the duration of the COVID-19  declaration under Section 56 4(b)(1) of the Act, 21 U.S.C. section 360bbb-3(b)(1), unless the authorization is terminated or revoked sooner. Performed at [facility], [place] [address] [place], [place] [address]     Renal Function: Recent Labs    01/18/20 [id number]  CREATININE 0.93   Estimated Creatinine Clearance: 74.2 mL/min (by C-G formula based on SCr of 0.93 mg/dL).  Radiologic Imaging: No results found.  I independently reviewed the above imaging studies.  Assessment and Plan Austin [name] is a 79 y.o. male with Gleason 7 prostate cancer  -The patient was counseled about the natural history of prostate cancer and the standard treatment options that are available for prostate cancer. It was explained to him how his age and life expectancy, clinical stage, Gleason score, and PSA affect his prognosis, the decision to proceed with additional staging studies, as well as how that information influences recommended treatment strategies. We discussed the roles for active surveillance, radiation therapy, surgical therapy, androgen deprivation, as well as ablative therapy options for the treatment of prostate cancer as appropriate to his individual cancer situation. We discussed the risks and benefits of these options with regard to their impact on cancer control and also in terms of potential adverse events, complications, and impact on quality of life particularly related to urinary and sexual function.   The patient has decided to proceed with cystoscopy, brachytherapy seed and SpaceOAR placement as primary treatment of his risk prostate cancer.  The risks, benefits and alternatives of the aforementioned procedures was discussed in detail.  Risks include, bur are not limited to worsening LUTS, erectile dysfunction, rectal irritation, urethral stricture formation, fistula formation, cancer recurrence, MI, CVA, PE, DVT and the inherent risk of general anesthesia.  He voices understanding and  wishes to proceed.      Ellison Hughs, MD 01/20/2020, 10:17 AM  [facility] Pager: [phone]

## 2020-01-24 NOTE — Progress Notes (Signed)
  Radiation Oncology         [phone] ________________________________  Name: Austin [name] MRN: [id number]  Date: 01/24/2020  DOB: 06-20-1941       Prostate Seed Implant  [id number], Hunt Oris, MD  No ref. provider found  DIAGNOSIS:  Oncology History   No history exists.    No diagnosis found.  PROCEDURE: Insertion of radioactive I-125 seeds into the prostate gland.  RADIATION DOSE: 145 Gy, definitive therapy.  TECHNIQUE: Austin [name] was brought to the operating room with the urologist. He was placed in the dorsolithotomy position. He was catheterized and a rectal tube was inserted. The perineum was shaved, prepped and draped. The ultrasound probe was then introduced into the rectum to see the prostate gland.  TREATMENT DEVICE: A needle grid was attached to the ultrasound probe stand and anchor needles were placed.  3D PLANNING: The prostate was imaged in 3D using a sagittal sweep of the prostate probe. These images were transferred to the planning computer. There, the prostate, urethra and rectum were defined on each axial reconstructed image. Then, the software created an optimized 3D plan and a few seed positions were adjusted. The quality of the plan was reviewed using [facility] information for the target and the following two organs at risk:  Urethra and Rectum.  Then the accepted plan was printed and handed off to the radiation therapist.  Under my supervision, the custom loading of the seeds and spacers was carried out and loaded into sealed vicryl sleeves.  These pre-loaded needles were then placed into the needle holder.Marland Kitchen  PROSTATE VOLUME STUDY:  Using transrectal ultrasound the volume of the prostate was verified to be 37.6 cc.  SPECIAL TREATMENT PROCEDURE/SUPERVISION AND HANDLING: The pre-loaded needles were then delivered under sagittal guidance. A total of 25 needles were used to deposit 76 seeds in the prostate gland. The individual seed activity was 0.388 mCi.  SpaceOAR:   Yes  COMPLEX SIMULATION: At the end of the procedure, an anterior radiograph of the pelvis was obtained to document seed positioning and count. Cystoscopy was performed to check the urethra and bladder.  MICRODOSIMETRY: At the end of the procedure, the patient was emitting 0.094 mR/hr at 1 meter. Accordingly, he was considered safe for hospital discharge.  PLAN: The patient will return to the radiation oncology clinic for post implant CT dosimetry in three weeks.   ________________________________  Sheral Apley Tammi Klippel, M.D.

## 2020-02-02 ENCOUNTER — Telehealth: Payer: Self-pay | Admitting: *Deleted

## 2020-02-02 NOTE — Telephone Encounter (Signed)
Called patient to inform of changes in Thursday's schedule (02/04/20), spoke with patient and he is aware

## 2020-02-03 ENCOUNTER — Telehealth: Payer: Self-pay | Admitting: *Deleted

## 2020-02-03 NOTE — Telephone Encounter (Signed)
CALLED PATIENT TO REMIND OF POST SEED APPTS. AND MRI FOR 02-04-20, LVM FOR A RETURN CALL

## 2020-02-04 ENCOUNTER — Ambulatory Visit
Admission: RE | Admit: 2020-02-04 | Discharge: 2020-02-04 | Disposition: A | Discharge status: Home / Self Care | Payer: Medicare Other | Source: Ambulatory Visit | Attending: Radiation Oncology | Admitting: Radiation Oncology

## 2020-02-04 ENCOUNTER — Ambulatory Visit
Admission: RE | Admit: 2020-02-04 | Discharge: 2020-02-04 | Disposition: A | Discharge status: Home / Self Care | Payer: Medicare Other | Source: Ambulatory Visit | Attending: Urology | Admitting: Urology

## 2020-02-04 ENCOUNTER — Ambulatory Visit (HOSPITAL_COMMUNITY): Payer: Medicare Other

## 2020-02-04 ENCOUNTER — Encounter: Payer: Self-pay | Admitting: Urology

## 2020-02-04 ENCOUNTER — Other Ambulatory Visit: Payer: Self-pay

## 2020-02-04 ENCOUNTER — Ambulatory Visit (HOSPITAL_COMMUNITY)
Admission: RE | Admit: 2020-02-04 | Discharge: 2020-02-04 | Disposition: A | Discharge status: Home / Self Care | Payer: Medicare Other | Source: Ambulatory Visit | Attending: Urology | Admitting: Urology

## 2020-02-04 ENCOUNTER — Encounter: Payer: Self-pay | Admitting: Medical Oncology

## 2020-02-04 DIAGNOSIS — C61 Malignant neoplasm of prostate: Secondary | ICD-10-CM | POA: Insufficient documentation

## 2020-02-04 NOTE — Progress Notes (Signed)
Patient denies any hematuria or dysuria feels he empties his bladder less than half the time. Reports some urgency with frequency reports weak stream half the time with nocturia of 3 times nightly. Follow up appointment May 7, 21 with urologist

## 2020-02-04 NOTE — Progress Notes (Signed)
Radiation Oncology         [phone] ________________________________  Name: Austin [name] MRN: [id number]  Date: 02/04/2020  DOB: 12/13/1940  Post-Seed Follow-Up Visit Note  CC: Biagio Borg, MD  Biagio Borg, MD  Diagnosis:   79 y.o. gentleman with Stage T1c adenocarcinoma of the prostate with Gleason score of 4+3, and PSA of 5.07.    ICD-10-CM   1. Malignant neoplasm of prostate (HCC)  C61     Interval Since Last Radiation:  2 weeks 01/20/2020:  Insertion of radioactive I-125 seeds into the prostate gland; 145 Gy, definitive therapy with placement of SpaceOAR gel.  Narrative:  The patient returns today for routine follow-up.  He is complaining of increased urinary frequency, urgency and urinary hesitation symptoms but these have significantly improved since starting Flomax. He filled out a questionnaire regarding urinary function today providing and overall IPSS score of 15 characterizing his symptoms as moderate.  His pre-implant score was 4. He currently denies dysuria, gross hematuria, flank pain, abdominal pain or bowel symptoms.  He feels that he is emptying his bladder well on voiding most of the time and denies straining to void or incontinence. He has not noticed any significant change in his energy level and has been able to remain active. Overall, he is quite pleased with his progress to date, particularly since starting the Flomax 10 days ago.  ALLERGIES:  is allergic to iohexol and ivp dye [iodinated diagnostic agents].  Meds: Current Outpatient Medications  Medication Sig Dispense Refill  . aspirin 81 MG tablet Take 81 mg by mouth daily.      Marland Kitchen oxybutynin (DITROPAN) 5 MG tablet Take 1 tablet (5 mg total) by mouth every 8 (eight) hours as needed for bladder spasms. 30 tablet 1  . phenazopyridine (PYRIDIUM) 200 MG tablet Take 1 tablet (200 mg total) by mouth 3 (three) times daily as needed (for pain with urination). 30 tablet 0  . ramipril (ALTACE) 5 MG capsule TAKE 1  CAPSULE BY MOUTH  DAILY 90 capsule 1  . triamcinolone cream (KENALOG) 0.1 % Apply 1 application topically 2 (two) times daily. (Patient taking differently: Apply 1 application topically as needed. ) 80 g 2  . Vitamin D, Ergocalciferol, (DRISDOL) 1.25 MG (50000 UT) CAPS capsule Take 1 capsule (50,000 Units total) by mouth every 7 (seven) days. 12 capsule 0  . Cyanocobalamin (VITAMIN B 12 PO) Injections every 30 days last took 2 montths ago     No current facility-administered medications for this encounter.    Physical Findings: In general this is a well appearing Caucasian male in no acute distress. He's alert and oriented x4 and appropriate throughout the examination. Cardiopulmonary assessment is negative for acute distress and he exhibits normal effort.   Lab Findings: Lab Results  Component Value Date   WBC 4.8 01/18/2020   HGB 13.2 01/18/2020   HCT 40.6 01/18/2020   MCV 95.5 01/18/2020   PLT 165 01/18/2020    Radiographic Findings:  Patient underwent CT imaging in our clinic for post implant dosimetry. The CT will be reviewed by Dr. Tammi Klippel to confirm there is an adequate distribution of radioactive seeds throughout the prostate gland and ensure that there are no seeds in or near the rectum. His scheduled for prostate MRI this afternoon at 4pm and those images will be fused with his CT images for further evaluation. We suspect the final radiation plan and dosimetry will show appropriate coverage of the prostate gland. He understands  that we will call and inform him of any unexpected findings on further review of his imaging and dosimetry.  Impression/Plan: 79 y.o. gentleman with Stage T1c adenocarcinoma of the prostate with Gleason score of 4+3, and PSA of 5.07. The patient is recovering from the effects of radiation. His urinary symptoms should gradually improve over the next 4-6 months. We talked about this today. He is encouraged by his improvement already and is otherwise pleased  with his outcome. We also talked about long-term follow-up for prostate cancer following seed implant. He understands that ongoing PSA determinations and digital rectal exams will help perform surveillance to rule out disease recurrence. He has a follow up appointment scheduled with Dr. Lovena Neighbours on 02/19/20. He understands what to expect with his PSA measures. Patient was also educated today about some of the long-term effects from radiation including a small risk for rectal bleeding and possibly erectile dysfunction. We talked about some of the general management approaches to these potential complications. However, I did encourage the patient to contact our office or return at any point if he has questions or concerns related to his previous radiation and prostate cancer.  Today, a comprehensive survivorship care plan and treatment summary was reviewed with the patient today detailing his prostate cancer diagnosis, treatment course, potential late/long-term effects of treatment, appropriate follow-up care with recommendations for the future, and patient education resources.  A copy of this summary, along with a letter will be sent to the patient's primary care provider via mail/fax/In Basket message after today's visit.  2. Cancer screening:  Due to Mr. Delo history and his age, he should receive screening for skin cancers, colon cancer, and lung cancer. The information and recommendations are listed on the patient's comprehensive care plan/treatment summary and were reviewed in detail with the patient.     3. Health maintenance and wellness promotion: Mr. Salom was encouraged to consume 5-7 servings of fruits and vegetables per day. He was provided a copy of the "Nutrition Rainbow" handout, as well as the handout "Take Control of Your Health and [place]" from the [place] [facility].  He was also encouraged to engage in moderate to vigorous exercise for 30 minutes per day most days of  the week. Information was provided regarding the [facility] fitness program, which is designed for cancer survivors to help them become more physically fit after cancer treatments. We discussed that a healthy BMI is 18.5-24.9 and that maintaining a healthy weight reduces risk of cancer recurrences.  He was instructed to limit his alcohol consumption and continue to abstain from tobacco use.  Lastly, he was encouraged to use sunscreen and wear protective clothing when in the sun.     4. Support services/counseling: It is not uncommon for this period of the patient's cancer care trajectory to be one of many emotions and stressors.  Mr. Krammer was encouraged to take advantage of our many support services programs, support groups, and/or counseling in coping with his new life as a cancer survivor after completing anti-cancer treatment.  He was offered support today through active listening and expressive supportive counseling.  He was given information regarding our available services and encouraged to contact me with any questions or for help enrolling in any of our support group/programs.       Nicholos Johns, PA-C

## 2020-02-08 NOTE — Progress Notes (Signed)
  Radiation Oncology         [phone] ________________________________  Name: DAMETRI DRANE MRN: [id number]  Date: 02/04/2020  DOB: 1940/11/19  COMPLEX SIMULATION NOTE  NARRATIVE:  The patient was brought to the [facility] suite today following prostate seed implantation approximately one month ago.  Identity was confirmed.  All relevant records and images related to the planned course of therapy were reviewed.  Then, the patient was set-up supine.  CT images were obtained.  The CT images were loaded into the planning software.  Then the prostate and rectum were contoured.  Treatment planning then occurred.  The implanted iodine 125 seeds were identified by the physics staff for projection of radiation distribution  I have requested : 3D Simulation  I have requested a DVH of the following structures: Prostate and rectum.    ________________________________  Sheral Apley Tammi Klippel, M.D.

## 2020-02-22 ENCOUNTER — Other Ambulatory Visit: Payer: Self-pay

## 2020-02-22 ENCOUNTER — Encounter: Payer: Self-pay | Admitting: Internal Medicine

## 2020-02-22 ENCOUNTER — Ambulatory Visit (INDEPENDENT_AMBULATORY_CARE_PROVIDER_SITE_OTHER): Payer: Medicare Other | Admitting: Internal Medicine

## 2020-02-22 VITALS — BP 130/72 | HR 69 | Temp 98.2°F | Ht 72.0 in | Wt 190.0 lb

## 2020-02-22 DIAGNOSIS — I10 Essential (primary) hypertension: Secondary | ICD-10-CM

## 2020-02-22 DIAGNOSIS — Z23 Encounter for immunization: Secondary | ICD-10-CM | POA: Diagnosis not present

## 2020-02-22 DIAGNOSIS — E559 Vitamin D deficiency, unspecified: Secondary | ICD-10-CM | POA: Insufficient documentation

## 2020-02-22 DIAGNOSIS — E538 Deficiency of other specified B group vitamins: Secondary | ICD-10-CM | POA: Insufficient documentation

## 2020-02-22 DIAGNOSIS — R739 Hyperglycemia, unspecified: Secondary | ICD-10-CM

## 2020-02-22 DIAGNOSIS — Z0000 Encounter for general adult medical examination without abnormal findings: Secondary | ICD-10-CM

## 2020-02-22 LAB — LIPID PANEL
Cholesterol: 139 mg/dL (ref 0–200)
HDL: 60.4 mg/dL (ref >39.00)
LDL Cholesterol: 68 mg/dL (ref 0–99)
NonHDL: 79.06
Total CHOL/HDL Ratio: 2
Triglycerides: 57 mg/dL (ref 0.0–149.0)
VLDL: 11.4 mg/dL (ref 0.0–40.0)

## 2020-02-22 LAB — VITAMIN D 25 HYDROXY (VIT D DEFICIENCY, FRACTURES): VITD: 34.24 ng/mL (ref 30.00–100.00)

## 2020-02-22 LAB — VITAMIN B12: Vitamin B-12: >1500 pg/mL — ABNORMAL HIGH (ref 211–911)

## 2020-02-22 LAB — TSH: TSH: 1.24 u[IU]/mL (ref 0.35–4.50)

## 2020-02-22 LAB — HEMOGLOBIN A1C: Hgb A1c MFr Bld: 5.5 % (ref 4.6–6.5)

## 2020-02-22 MED ORDER — CYANOCOBALAMIN 1000 MCG/ML IJ SOLN
1000.0000 ug | Freq: Once | INTRAMUSCULAR | Status: AC
  Administered 2020-02-22: 1000 ug via INTRAMUSCULAR

## 2020-02-22 MED ORDER — TRIAMCINOLONE ACETONIDE 0.1 % EX CREA: 1.0000 "application " | TOPICAL_CREAM | Freq: Two times a day (BID) | CUTANEOUS | 2 refills | Status: DC

## 2020-02-22 MED ORDER — RAMIPRIL 5 MG PO CAPS: 5.0000 mg | ORAL_CAPSULE | Freq: Every day | ORAL | 3 refills | Status: DC

## 2020-02-22 NOTE — Assessment & Plan Note (Signed)

## 2020-02-22 NOTE — Progress Notes (Signed)
Subjective:    Patient ID: Austin [name], male    DOB: 02-Dec-1940, 79 y.o.   MRN: [id number]  HPI  Here for wellness and f/u;  Overall doing ok;  Pt denies Chest pain, worsening SOB, DOE, wheezing, orthopnea, PND, worsening LE edema, palpitations, dizziness or syncope.  Pt denies neurological change such as new headache, facial or extremity weakness.  Pt denies polydipsia, polyuria, or low sugar symptoms. Pt states overall good compliance with treatment and medications, good tolerability, and has been trying to follow appropriate diet.  Pt denies worsening depressive symptoms, suicidal ideation or panic. No fever, night sweats, wt loss, loss of appetite, or other constitutional symptoms.  Pt states good ability with ADL's, has low fall risk, home safety reviewed and adequate, no other significant changes in hearing or vision, and only occasionally active with exercise. S/p prostate ca seeding, for f/u at 2 mo.  For B12 IM today, then change to oral.  Past Medical History:  Diagnosis Date  . Allergy   . Cataract    forming left eye   . History of kidney stones   . History of nephrolithiasis   . Hypertension   . Low back pain   . Prostate cancer [facility])    Past Surgical History:  Procedure Laterality Date  . COLONOSCOPY    . HEMORRHOID SURGERY  age 84  . POLYPECTOMY    . PROSTATE BIOPSY    . RADIOACTIVE SEED IMPLANT N/A 01/20/2020   Procedure: RADIOACTIVE SEED IMPLANT/BRACHYTHERAPY IMPLANT;  Surgeon: Ceasar Mons, MD;  Location: [facility];  Service: Urology;  Laterality: N/A;  . SPACE OAR INSTILLATION N/A 01/20/2020   Procedure: SPACE OAR INSTILLATION;  Surgeon: Ceasar Mons, MD;  Location: [facility];  Service: Urology;  Laterality: N/A;    reports that he quit smoking about 55 years ago. His smoking use included cigarettes. He has a 1.75 pack-year smoking history. He has never used smokeless tobacco. He reports current alcohol use.  He reports that he does not use drugs. family history includes COPD in an other family member; Cancer in an other family member; Colon polyps in his maternal uncle; Diabetes in his mother; Heart disease in an other family member; Liver cancer (age of onset: 41) in his father; Psoriasis in an other family member. Allergies  Allergen Reactions  . Iohexol Hives  . Ivp Dye [Iodinated Diagnostic Agents] Hives   Current Outpatient Medications on File Prior to Visit  Medication Sig Dispense Refill  . aspirin 81 MG tablet Take 81 mg by mouth daily.      . Cholecalciferol 50 MCG (2000 UT) CAPS Take by mouth.    . Cyanocobalamin (VITAMIN B 12 PO) Injections every 30 days last took 2 montths ago    . tamsulosin (FLOMAX) 0.4 MG CAPS capsule Take 0.4 mg by mouth daily.     No current facility-administered medications on file prior to visit.   Review of Systems All otherwise neg per pt     Objective:   Physical Exam BP 130/72 (BP Location: Left Arm, Patient Position: Sitting, Cuff Size: Large)   Pulse 69   Temp 98.2 F (36.8 C) (Oral)   Ht 6' (1.829 m)   Wt 190 lb (86.2 kg)   SpO2 98%   BMI 25.77 kg/m  VS noted,  Constitutional: Pt appears in NAD HENT: Head: NCAT.  Right Ear: External ear normal.  Left Ear: External ear normal.  Eyes: . Pupils are equal,  round, and reactive to light. Conjunctivae and EOM are normal Nose: without d/c or deformity Neck: Neck supple. Gross normal ROM Cardiovascular: Normal rate and regular rhythm.   Pulmonary/Chest: Effort normal and breath sounds without rales or wheezing.  Abd:  Soft, NT, ND, + BS, no organomegaly Neurological: Pt is alert. At baseline orientation, motor grossly intact Skin: Skin is warm. No rashes, other new lesions, no LE edema Psychiatric: Pt behavior is normal without agitation  All otherwise neg per pt Lab Results  Component Value Date   WBC 4.8 01/18/2020   HGB 13.2 01/18/2020   HCT 40.6 01/18/2020   PLT 165 01/18/2020    GLUCOSE 103 (H) 01/18/2020   CHOL 139 02/22/2020   TRIG 57.0 02/22/2020   HDL 60.40 02/22/2020   LDLCALC 68 02/22/2020   ALT 21 01/18/2020   AST 22 01/18/2020   NA 141 01/18/2020   K 4.6 01/18/2020   CL 108 01/18/2020   CREATININE 0.93 01/18/2020   BUN 17 01/18/2020   CO2 27 01/18/2020   TSH 1.24 02/22/2020   PSA 5.07 (H) 06/17/2019   INR 1.0 01/18/2020   HGBA1C 5.5 02/22/2020         Assessment & Plan:

## 2020-02-22 NOTE — Patient Instructions (Addendum)
Ok to go to the website [email or web address] for the covid vaccine scheduling  You had the b12 shot today, and the Tdap tetanus shot   Please continue all other medications as before, and refills have been done if requested.  Please have the pharmacy call with any other refills you may need.  Please continue your efforts at being more active, low cholesterol diet, and weight control.  You are otherwise up to date with prevention measures today.  Please keep your appointments with your specialists as you may have planned  Please go to the LAB at the blood drawing area for the tests to be done  You will be contacted by phone if any changes need to be made immediately.  Otherwise, you will receive a letter about your results with an explanation, but please check with MyChart first.  Please remember to sign up for MyChart if you have not done so, as this will be important to you in the future with finding out test results, communicating by private email, and scheduling acute appointments online when needed.  Please make an Appointment to return for your 1 year visit, or sooner if needed, with Lab testing by Appointment as well, to be done about 3-5 days before at the [place] (so this is for TWO appointments - please see the scheduling desk as you leave)

## 2020-02-22 NOTE — Assessment & Plan Note (Signed)
For b12 1 gm im today, then change to po

## 2020-02-22 NOTE — Assessment & Plan Note (Signed)
Continue oral replacement.  

## 2020-02-22 NOTE — Assessment & Plan Note (Signed)
stable overall by history and exam, recent data reviewed with pt, and pt to continue medical treatment as before,  to f/u any worsening symptoms or concerns  

## 2020-02-24 ENCOUNTER — Encounter: Payer: Self-pay | Admitting: Radiation Oncology

## 2020-02-26 ENCOUNTER — Encounter: Payer: Self-pay | Admitting: Internal Medicine

## 2020-03-15 NOTE — Progress Notes (Signed)
  Radiation Oncology         [phone]) [phone] ________________________________  Name: Austin [name] MRN: [id number]  Date: 02/24/2020  DOB: September 24, 1941  3D Planning Note   Prostate Brachytherapy Post-Implant Dosimetry  Diagnosis: 79 y.o. gentleman with Stage T1c adenocarcinoma of the prostate with Gleason score of 4+3, and PSA of 5.07  Narrative: On a previous date, Austin [name] returned following prostate seed implantation for post implant planning. He underwent CT scan complex simulation to delineate the three-dimensional structures of the pelvis and demonstrate the radiation distribution.  Since that time, the seed localization, and complex isodose planning with dose volume histograms have now been completed.  Results:   Prostate Coverage - The dose of radiation delivered to the 90% or more of the prostate gland (D90) was 90.51% of the prescription dose. This exceeds our goal of greater than 90%. Rectal Sparing - The volume of rectal tissue receiving the prescription dose or higher was 0.0 cc. This falls under our thresholds tolerance of 1.0 cc.  Impression: The prostate seed implant appears to show adequate target coverage and appropriate rectal sparing.  Plan:  The patient will continue to follow with urology for ongoing PSA determinations. I would anticipate a high likelihood for local tumor control with minimal risk for rectal morbidity.  ________________________________  Sheral Apley Tammi Klippel, M.D.

## 2020-03-29 IMAGING — NM NM BONE WHOLE BODY
2 series · 2 of 2 positions shown · non-contrast
Comparison: None

Correlation: CT abdomen and pelvis 10/21/2019

CLINICAL DATA: Prostate cancer, PSA

EXAM:
NUCLEAR MEDICINE WHOLE BODY BONE SCAN
TECHNIQUE: Whole body anterior and posterior images were obtained approximately
3 hours after intravenous injection of radiopharmaceutical.
RADIOPHARMACEUTICALS:  20.4 mCi 8echnetium-KKm MDP IV

[Series 1: whole body · 2.66mm/px · 1 of 1 slices shown (1 of 2)]
[im 1/1]
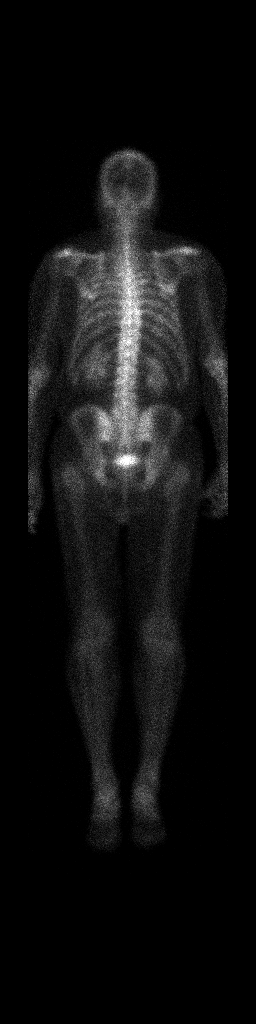

[Series 1: whole body · 2.66mm/px · 1 of 1 slices shown (2 of 2)]
[im 1/1]
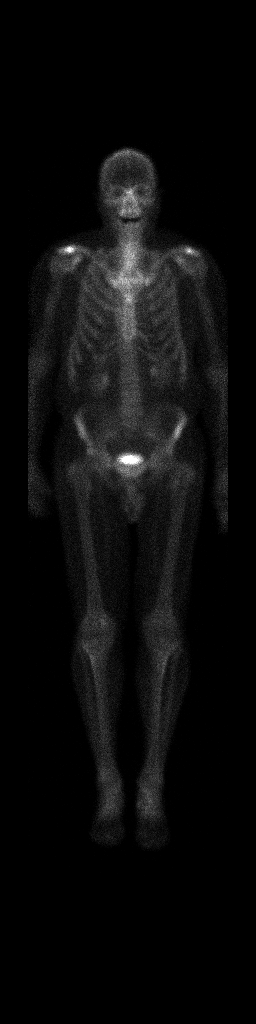

[2 of 2 positions shown; findings below may reference images not displayed]

FINDINGS: Uptake at the shoulders, sternoclavicular joints, mildly at RIGHT
hip and RIGHT knee, typically degenerative.

Denture artifacts.

Subtle foci of abnormal increased tracer accumulation are seen at
the posterior sixth ribs bilaterally concerning for osseous
metastases.

No additional sites of abnormal osseous tracer accumulation.

Expected urinary tract and soft tissue distribution of tracer.
IMPRESSION: Increased uptake at the posterior sixth ribs bilaterally suspicious
for osseous metastases.

## 2020-03-31 ENCOUNTER — Encounter: Payer: Self-pay | Admitting: Internal Medicine

## 2020-04-22 DIAGNOSIS — R3915 Urgency of urination: Secondary | ICD-10-CM | POA: Diagnosis not present

## 2020-05-10 DIAGNOSIS — M19012 Primary osteoarthritis, left shoulder: Secondary | ICD-10-CM | POA: Diagnosis not present

## 2020-05-10 DIAGNOSIS — M25512 Pain in left shoulder: Secondary | ICD-10-CM | POA: Diagnosis not present

## 2020-05-25 IMAGING — DX DG CHEST 2V
2 series · 2 of 2 positions shown · non-contrast
Comparison: PA and lateral chest 10/26/2019.

CLINICAL DATA: Preoperative examination.

EXAM:
CHEST - 2 VIEW

[chest pa]
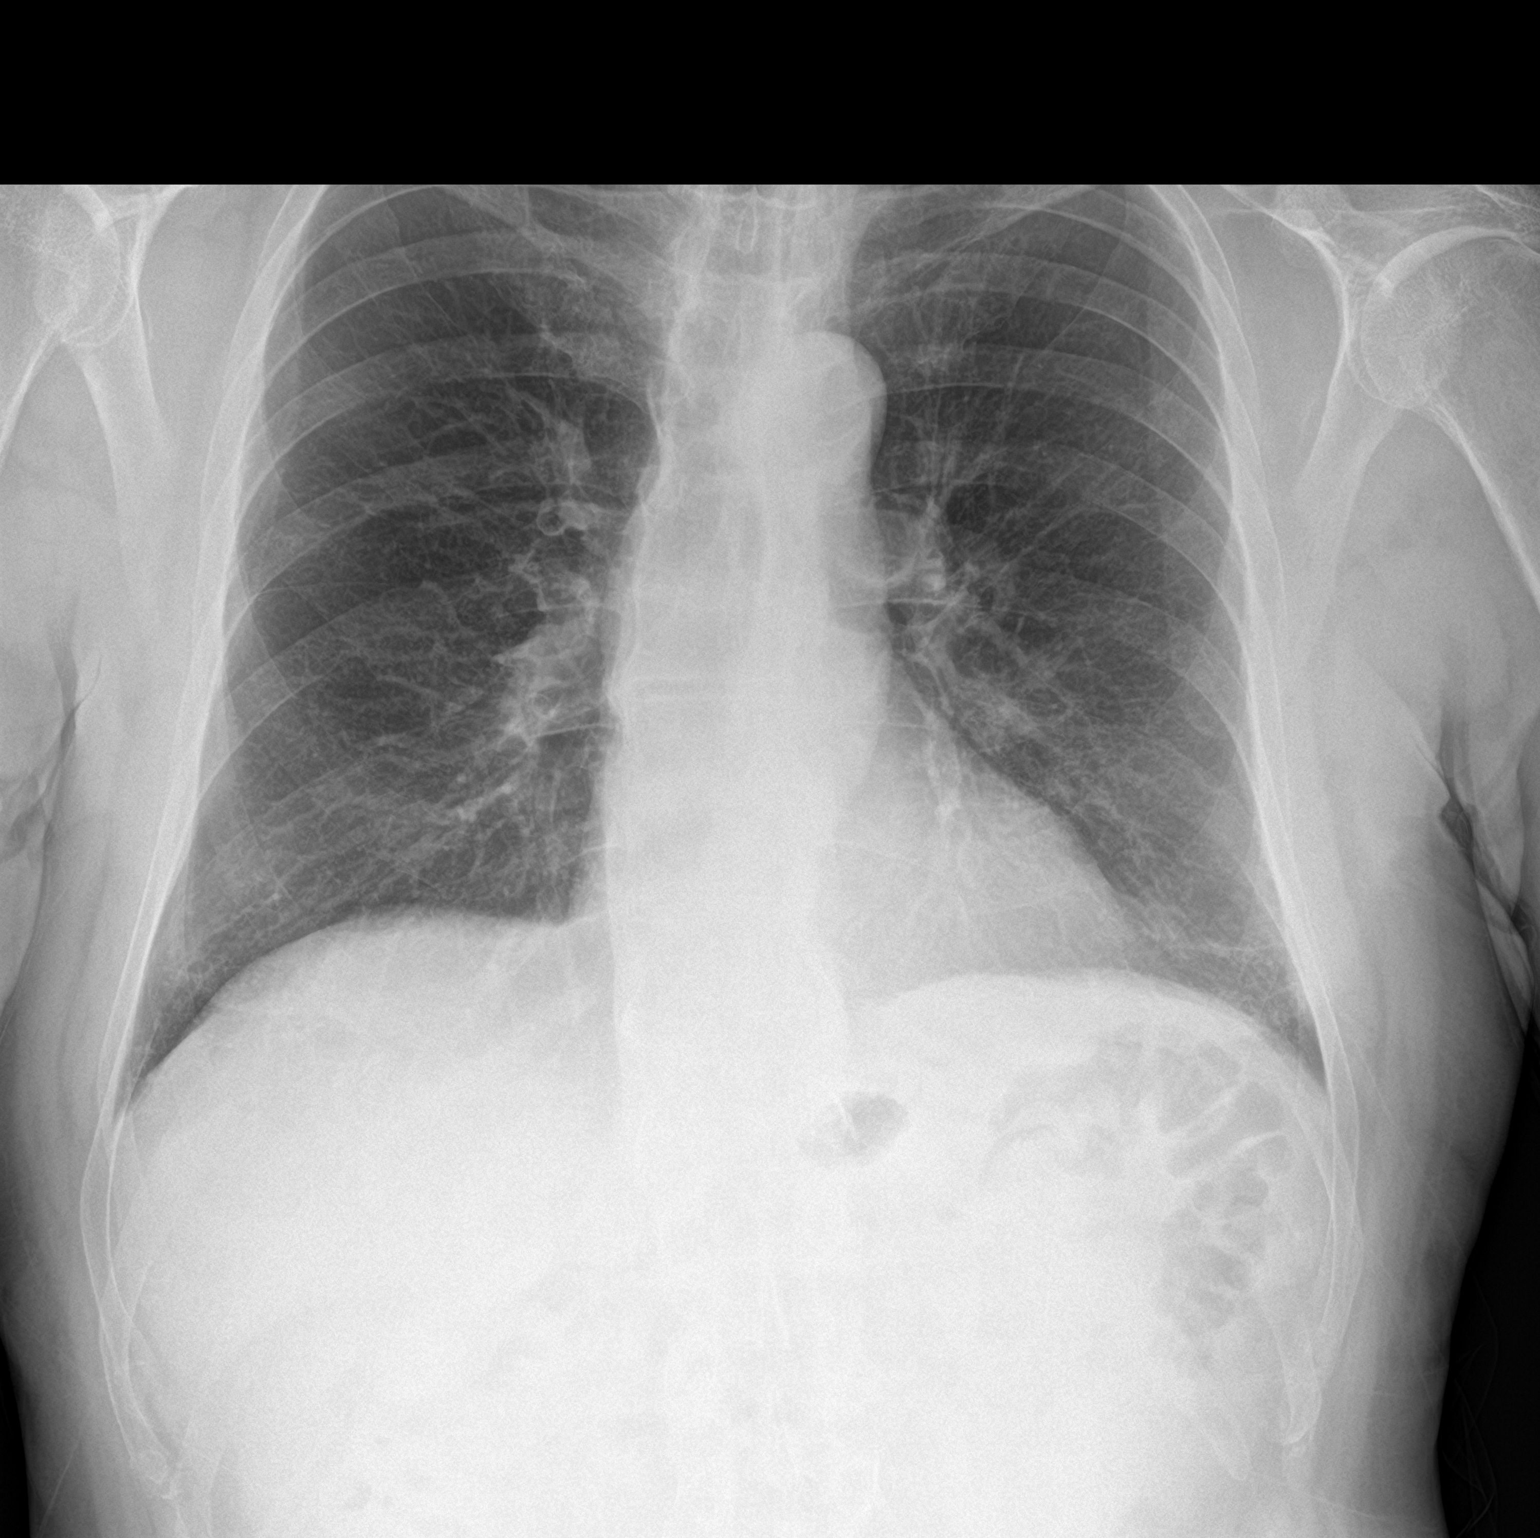

[chest lat]
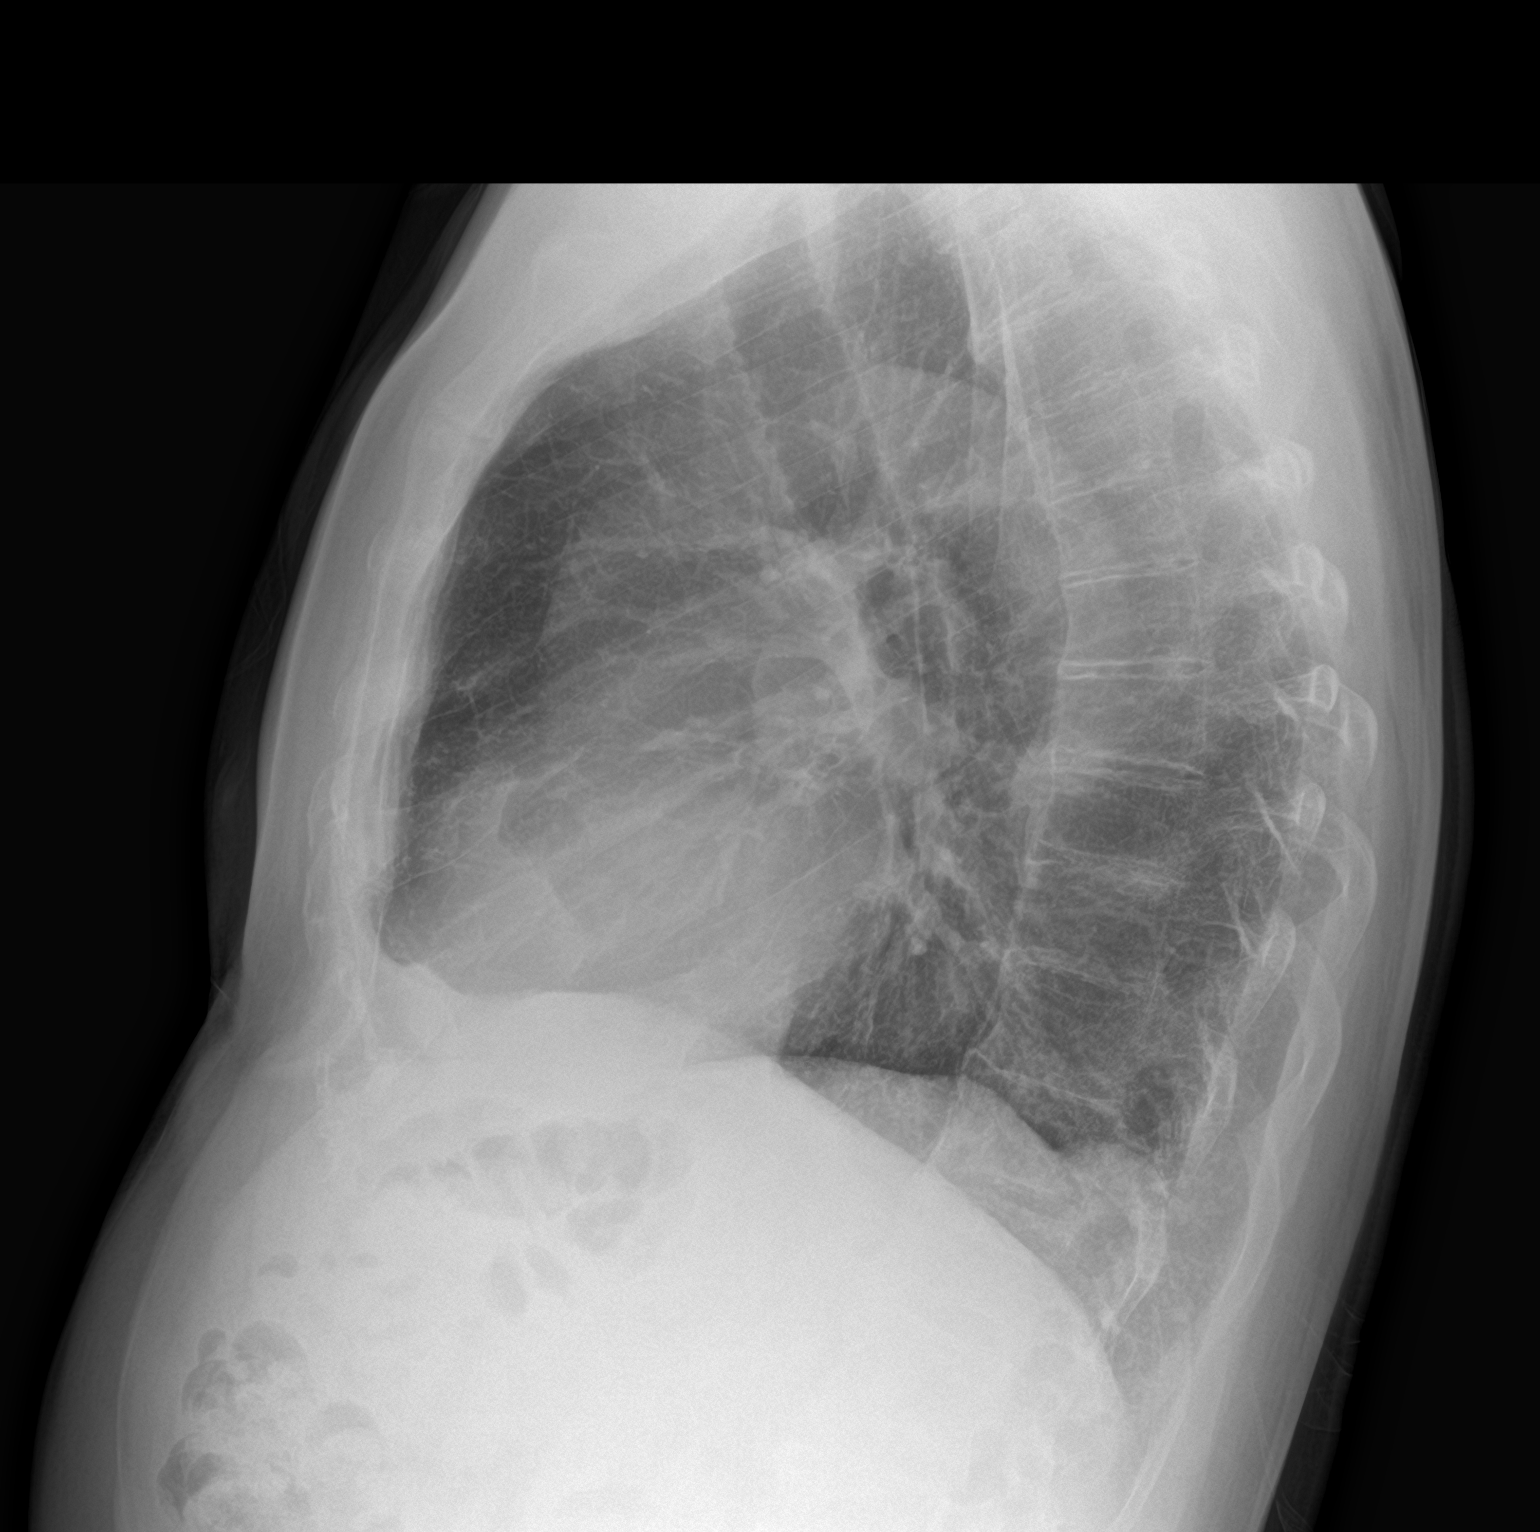

[2 of 2 positions shown; findings below may reference images not displayed]

FINDINGS: Lungs clear. Heart size normal. No pneumothorax or pleural effusion.
Atherosclerosis noted.
IMPRESSION: No acute disease.

## 2020-06-07 DIAGNOSIS — H2513 Age-related nuclear cataract, bilateral: Secondary | ICD-10-CM | POA: Diagnosis not present

## 2020-06-07 DIAGNOSIS — H18413 Arcus senilis, bilateral: Secondary | ICD-10-CM | POA: Diagnosis not present

## 2020-06-07 DIAGNOSIS — H25013 Cortical age-related cataract, bilateral: Secondary | ICD-10-CM | POA: Diagnosis not present

## 2020-06-07 DIAGNOSIS — H2512 Age-related nuclear cataract, left eye: Secondary | ICD-10-CM | POA: Diagnosis not present

## 2020-06-07 DIAGNOSIS — H25043 Posterior subcapsular polar age-related cataract, bilateral: Secondary | ICD-10-CM | POA: Diagnosis not present

## 2020-06-14 ENCOUNTER — Encounter: Payer: Self-pay | Admitting: Internal Medicine

## 2020-06-29 DIAGNOSIS — L570 Actinic keratosis: Secondary | ICD-10-CM | POA: Diagnosis not present

## 2020-06-29 DIAGNOSIS — D225 Melanocytic nevi of trunk: Secondary | ICD-10-CM | POA: Diagnosis not present

## 2020-06-29 DIAGNOSIS — L821 Other seborrheic keratosis: Secondary | ICD-10-CM | POA: Diagnosis not present

## 2020-06-29 DIAGNOSIS — L814 Other melanin hyperpigmentation: Secondary | ICD-10-CM | POA: Diagnosis not present

## 2020-06-29 DIAGNOSIS — D1801 Hemangioma of skin and subcutaneous tissue: Secondary | ICD-10-CM | POA: Diagnosis not present

## 2020-06-29 DIAGNOSIS — L820 Inflamed seborrheic keratosis: Secondary | ICD-10-CM | POA: Diagnosis not present

## 2020-07-01 DIAGNOSIS — H2511 Age-related nuclear cataract, right eye: Secondary | ICD-10-CM | POA: Diagnosis not present

## 2020-07-01 DIAGNOSIS — H2512 Age-related nuclear cataract, left eye: Secondary | ICD-10-CM | POA: Diagnosis not present

## 2020-07-01 DIAGNOSIS — Z961 Presence of intraocular lens: Secondary | ICD-10-CM | POA: Diagnosis not present

## 2020-07-04 ENCOUNTER — Encounter: Payer: Self-pay | Admitting: Internal Medicine

## 2020-07-07 NOTE — Telephone Encounter (Signed)
[place] for [place] next available or uC or ED if fever, worsening abd pain, n/v or blood or other unusual symptoms

## 2020-07-12 ENCOUNTER — Encounter: Payer: Self-pay | Admitting: Internal Medicine

## 2020-07-12 ENCOUNTER — Ambulatory Visit (INDEPENDENT_AMBULATORY_CARE_PROVIDER_SITE_OTHER): Payer: Medicare Other | Admitting: Internal Medicine

## 2020-07-12 ENCOUNTER — Other Ambulatory Visit (INDEPENDENT_AMBULATORY_CARE_PROVIDER_SITE_OTHER): Payer: Medicare Other

## 2020-07-12 ENCOUNTER — Other Ambulatory Visit: Payer: Self-pay

## 2020-07-12 DIAGNOSIS — I10 Essential (primary) hypertension: Secondary | ICD-10-CM

## 2020-07-12 DIAGNOSIS — R194 Change in bowel habit: Secondary | ICD-10-CM

## 2020-07-12 DIAGNOSIS — K921 Melena: Secondary | ICD-10-CM

## 2020-07-12 LAB — CBC WITH DIFFERENTIAL/PLATELET
Basophils Absolute: 0.1 10*3/uL (ref 0.0–0.1)
Basophils Relative: 1.1 % (ref 0.0–3.0)
Eosinophils Absolute: 0.3 10*3/uL (ref 0.0–0.7)
Eosinophils Relative: 5.1 % — ABNORMAL HIGH (ref 0.0–5.0)
HCT: 40.2 % (ref 39.0–52.0)
Hemoglobin: 13.5 g/dL (ref 13.0–17.0)
Lymphocytes Relative: 13.2 % (ref 12.0–46.0)
Lymphs Abs: 0.7 10*3/uL (ref 0.7–4.0)
MCHC: 33.7 g/dL (ref 30.0–36.0)
MCV: 93.9 fl (ref 78.0–100.0)
Monocytes Absolute: 0.6 10*3/uL (ref 0.1–1.0)
Monocytes Relative: 11.1 % (ref 3.0–12.0)
Neutro Abs: 3.5 10*3/uL (ref 1.4–7.7)
Neutrophils Relative %: 69.5 % (ref 43.0–77.0)
Platelets: 160 10*3/uL (ref 150.0–400.0)
RBC: 4.28 Mil/uL (ref 4.22–5.81)
RDW: 13 % (ref 11.5–15.5)
WBC: 5 10*3/uL (ref 4.0–10.5)

## 2020-07-12 LAB — COMPREHENSIVE METABOLIC PANEL
ALT: 11 U/L (ref 0–53)
AST: 17 U/L (ref 0–37)
Albumin: 4.4 g/dL (ref 3.5–5.2)
Alkaline Phosphatase: 47 U/L (ref 39–117)
BUN: 15 mg/dL (ref 6–23)
CO2: 28 mEq/L (ref 19–32)
Calcium: 9.5 mg/dL (ref 8.4–10.5)
Chloride: 104 mEq/L (ref 96–112)
Creatinine, Ser: 0.92 mg/dL (ref 0.40–1.50)
GFR: 79.8 mL/min (ref >60.00)
Glucose, Bld: 102 mg/dL — ABNORMAL HIGH (ref 70–99)
Potassium: 4.6 mEq/L (ref 3.5–5.1)
Sodium: 139 mEq/L (ref 135–145)
Total Bilirubin: 0.9 mg/dL (ref 0.2–1.2)
Total Protein: 7.1 g/dL (ref 6.0–8.3)

## 2020-07-12 LAB — C-REACTIVE PROTEIN: CRP: <1 mg/dL (ref 0.5–20.0)

## 2020-07-12 LAB — SEDIMENTATION RATE: Sed Rate: 17 mm/hr (ref 0–20)

## 2020-07-12 NOTE — Assessment & Plan Note (Signed)
One episodes but significant BRBPR and none noted further, suspect ? Tear or diverticular bleed transient, for cbc with labs

## 2020-07-12 NOTE — Patient Instructions (Addendum)
Please continue all other medications as before, and refills have been done if requested.  Please have the pharmacy call with any other refills you may need.  Please continue your efforts at being more active, low cholesterol diet, and weight control.  Please keep your appointments with your specialists as you may have planned  You will be contacted regarding the referral for: GI - Dr Hilarie Fredrickson  Please go to the LAB at the blood drawing area for the tests to be done - at the [place] will be contacted by phone if any changes need to be made immediately.  Otherwise, you will receive a letter about your results with an explanation, but please check with MyChart first.  Please remember to sign up for MyChart if you have not done so, as this will be important to you in the future with finding out test results, communicating by private email, and scheduling acute appointments online when needed.

## 2020-07-12 NOTE — Progress Notes (Signed)
Subjective:    Patient ID: Austin [name], male    DOB: [birth year], 79 y.o.   MRN: [id number]  HPI  Here with c/o stool frequency (non diarrheal) gradually worsening to every hour over the weekend, then slowed up yesterday again; started over 1 mo ago, with normal appearing stool that seems dfferent character with different shaped stool with normal brown color, then increased freq above, associated with urgency feeling, a sensation of blockage and pressure better with voiding no matter how much was voided (many times very little), but no fever, n/v, lower appetite, and Denies worsening reflux, abd pain, dysphagia, though has lost wt somewhat as trying to reduce calories to reduce symptoms, and did have one episode (has picture on phone) of fair amount of BRBPR on sept 8, but no dizziness, syncope and no further bleeding. Has tried to adjust diet to more bland foods and less high calorie processed foods but no change in stool.   Wondering if has parasites.  Last colonoscopy oct 2020 with dr pyrtle.GI with diverticulosis, ad one 5 mm polyp.  No prior EGD.  Also incidentally,  S/p left eye cataract, now for right cataract surgury on Friday coming up.  No recent med changes or antibiotics.  No anticoagulant, only takes asa 81 mg daily.  No hx of c diff.  Did have prostate seed implant per rad oncology in April 2021 but now sure how could be related temporally.   Past Medical History:  Diagnosis Date  . Allergy   . Aortic atherosclerosis ([place])   . Cataract    forming left eye   . Cholelithiasis   . Hepatic steatosis   . History of kidney stones   . History of nephrolithiasis   . Hypertension   . Inguinal hernia   . Internal hemorrhoids   . Low back pain   . Prostate cancer ([place])   . Renal cyst   . Tubular adenoma of colon    Past Surgical History:  Procedure Laterality Date  . COLONOSCOPY    . HEMORRHOID SURGERY  age 22  . POLYPECTOMY    . PROSTATE BIOPSY    . RADIOACTIVE SEED IMPLANT N/A  01/20/2020   Procedure: RADIOACTIVE SEED IMPLANT/BRACHYTHERAPY IMPLANT;  Surgeon: Ceasar Mons, MD;  Location: [facility];  Service: Urology;  Laterality: N/A;  . SPACE OAR INSTILLATION N/A 01/20/2020   Procedure: SPACE OAR INSTILLATION;  Surgeon: Ceasar Mons, MD;  Location: [facility];  Service: Urology;  Laterality: N/A;    reports that he quit smoking about 55 years ago. His smoking use included cigarettes. He has a 1.75 pack-year smoking history. He has never used smokeless tobacco. He reports current alcohol use. He reports that he does not use drugs. family history includes COPD in an other family member; Cancer in an other family member; Colon polyps in his maternal uncle; Diabetes in his mother; Heart disease in an other family member; Liver cancer (age of onset: 45) in his father; Psoriasis in an other family member. Allergies  Allergen Reactions  . Iohexol Hives  . Ivp Dye [Iodinated Diagnostic Agents] Hives   Current Outpatient Medications on File Prior to Visit  Medication Sig Dispense Refill  . aspirin 81 MG tablet Take 81 mg by mouth daily.      . Cholecalciferol 50 MCG (2000 UT) CAPS Take by mouth.    . diclofenac Sodium (VOLTAREN) 1 % GEL diclofenac 1 % topical gel    . DUREZOL  0.05 % EMUL Place 1 drop into the left eye 3 (three) times daily.    . ergocalciferol (VITAMIN D2) 1.25 MG (50000 UT) capsule ergocalciferol (vitamin D2) 1,250 mcg (50,000 unit) capsule  TAKE 1 CAPSULE (50,000 UNITS TOTAL) BY MOUTH EVERY 7 (SEVEN) DAYS.    . fluocinolone (SYNALAR) 0.01 % external solution SMARTSIG:Sparingly Topical Twice Daily PRN    . gatifloxacin (ZYMAXID) 0.5 % SOLN Place 1 drop into the left eye 4 (four) times daily.    Marland Kitchen oxybutynin (DITROPAN) 5 MG tablet oxybutynin chloride 5 mg tablet  TAKE 1 TABLET (5 MG TOTAL) BY MOUTH EVERY 8 (EIGHT) HOURS AS NEEDED FOR BLADDER SPASMS.    Marland Kitchen phenazopyridine (PYRIDIUM) 200 MG tablet  phenazopyridine 200 mg tablet  TAKE 1 TABLET (200 MG TOTAL) BY MOUTH 3 (THREE) TIMES DAILY AS NEEDED (FOR PAIN WITH URINATION).    Marland Kitchen PROLENSA 0.07 % SOLN Place 1 drop into the left eye at bedtime.    . ramipril (ALTACE) 5 MG capsule Take 1 capsule (5 mg total) by mouth daily. 90 capsule 3  . tamsulosin (FLOMAX) 0.4 MG CAPS capsule Take 0.4 mg by mouth daily.    Marland Kitchen triamcinolone cream (KENALOG) 0.1 % Apply 1 application topically 2 (two) times daily. 80 g 2   No current facility-administered medications on file prior to visit.   Review of Systems All otherwise neg per pt     Objective:   Physical Exam BP 140/72 (BP Location: Left Arm, Patient Position: Sitting, Cuff Size: Large)   Pulse 69   Temp 98.1 F (36.7 C) (Oral)   Ht 6' (1.829 m)   Wt 190 lb (86.2 kg)   SpO2 96%   BMI 25.77 kg/m  VS noted,  Constitutional: Pt appears in NAD HENT: Head: NCAT.  Right Ear: External ear normal.  Left Ear: External ear normal.  Eyes: . Pupils are equal, round, and reactive to light. Conjunctivae and EOM are normal Nose: without d/c or deformity Neck: Neck supple. Gross normal ROM Cardiovascular: Normal rate and regular rhythm.   Pulmonary/Chest: Effort normal and breath sounds without rales or wheezing.  Abd:  Soft, NT, ND, + BS, no organomegaly Neurological: Pt is alert. At baseline orientation, motor grossly intact Skin: Skin is warm. No rashes, other new lesions, no LE edema Psychiatric: Pt behavior is normal without agitation  All otherwise neg per pt Lab Results  Component Value Date   WBC 5.0 07/12/2020   HGB 13.5 07/12/2020   HCT 40.2 07/12/2020   PLT 160.0 07/12/2020   GLUCOSE 102 (H) 07/12/2020   CHOL 139 02/22/2020   TRIG 57.0 02/22/2020   HDL 60.40 02/22/2020   LDLCALC 68 02/22/2020   ALT 11 07/12/2020   AST 17 07/12/2020   NA 139 07/12/2020   K 4.6 07/12/2020   CL 104 07/12/2020   CREATININE 0.92 07/12/2020   BUN 15 07/12/2020   CO2 28 07/12/2020   TSH 1.24  02/22/2020   PSA 5.07 (H) 06/17/2019   INR 1.0 01/18/2020   HGBA1C 5.5 02/22/2020      Assessment & Plan:

## 2020-07-12 NOTE — Assessment & Plan Note (Addendum)
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, for labs as ordered, and refer GI dr pyrtle for probable need for sigmoidoscopy, but pt states only wants initial visit with GI for week after next due to coming Friday cataract surgury and need for rest for 1 wk  I spent 41 minutes in preparing to see the patient by review of recent labs, imaging and procedures, obtaining and reviewing separately obtained history, communicating with the patient and family or caregiver, ordering medications, tests or procedures, and documenting clinical information in the EHR including the differential Dx, treatment, and any further evaluation and other management of bowel habit change, hematochezia, and htn

## 2020-07-13 ENCOUNTER — Other Ambulatory Visit: Payer: Medicare Other

## 2020-07-13 DIAGNOSIS — R194 Change in bowel habit: Secondary | ICD-10-CM

## 2020-07-13 DIAGNOSIS — K921 Melena: Secondary | ICD-10-CM

## 2020-07-14 ENCOUNTER — Encounter: Payer: Self-pay | Admitting: *Deleted

## 2020-07-14 ENCOUNTER — Encounter: Payer: Self-pay | Admitting: Internal Medicine

## 2020-07-14 LAB — CLOSTRIDIUM DIFFICILE BY PCR: Toxigenic C. Difficile by PCR: NEGATIVE

## 2020-07-15 ENCOUNTER — Ambulatory Visit: Payer: Medicare Other | Admitting: Internal Medicine

## 2020-07-15 DIAGNOSIS — H2511 Age-related nuclear cataract, right eye: Secondary | ICD-10-CM | POA: Diagnosis not present

## 2020-07-17 ENCOUNTER — Encounter: Payer: Self-pay | Admitting: Internal Medicine

## 2020-07-17 LAB — GI PROFILE, STOOL, PCR
Adenovirus F 40/41: NOT DETECTED
Astrovirus: NOT DETECTED
C difficile toxin A/B: NOT DETECTED
Campylobacter: NOT DETECTED
Cryptosporidium: DETECTED — AB
Cyclospora cayetanensis: NOT DETECTED
Entamoeba histolytica: NOT DETECTED
Enteroaggregative E coli: NOT DETECTED
Enteropathogenic E coli: NOT DETECTED
Enterotoxigenic E coli: NOT DETECTED
Giardia lamblia: NOT DETECTED
Norovirus GI/GII: NOT DETECTED
Plesiomonas shigelloides: NOT DETECTED
Rotavirus A: NOT DETECTED
Salmonella: NOT DETECTED
Sapovirus: NOT DETECTED
Shiga-toxin-producing E coli: NOT DETECTED
Shigella/Enteroinvasive E coli: NOT DETECTED
Vibrio cholerae: NOT DETECTED
Vibrio: NOT DETECTED
Yersinia enterocolitica: NOT DETECTED

## 2020-07-17 NOTE — Assessment & Plan Note (Signed)
stable overall by history and exam, recent data reviewed with pt, and pt to continue medical treatment as before,  to f/u any worsening symptoms or concerns  

## 2020-07-18 ENCOUNTER — Encounter: Payer: Self-pay | Admitting: Internal Medicine

## 2020-07-18 MED ORDER — DIPHENOXYLATE-ATROPINE 2.5-0.025 MG PO TABS: 1.0000 | ORAL_TABLET | Freq: Four times a day (QID) | ORAL | 0 refills | Status: DC | PRN

## 2020-07-18 MED ORDER — HYDROCORTISONE ACETATE 25 MG RE SUPP: 25.0000 mg | Freq: Two times a day (BID) | RECTAL | 1 refills | Status: DC

## 2020-07-19 ENCOUNTER — Other Ambulatory Visit: Payer: Self-pay | Admitting: Internal Medicine

## 2020-07-19 MED ORDER — HYDROCORTISONE (PERIANAL) 2.5 % EX CREA: 1.0000 "application " | TOPICAL_CREAM | Freq: Two times a day (BID) | CUTANEOUS | 1 refills | Status: DC

## 2020-07-21 LAB — OVA AND PARASITE EXAMINATION
CONCENTRATE RESULT:: NONE SEEN
MICRO NUMBER:: 11010791
SPECIMEN QUALITY:: ADEQUATE
TRICHROME RESULT:: NONE SEEN

## 2020-07-27 ENCOUNTER — Encounter: Payer: Self-pay | Admitting: Internal Medicine

## 2020-07-27 DIAGNOSIS — K921 Melena: Secondary | ICD-10-CM

## 2020-07-27 NOTE — Telephone Encounter (Signed)
PCCs to note pt concern, please

## 2020-08-02 ENCOUNTER — Encounter: Payer: Self-pay | Admitting: Internal Medicine

## 2020-08-02 ENCOUNTER — Other Ambulatory Visit (INDEPENDENT_AMBULATORY_CARE_PROVIDER_SITE_OTHER): Payer: Medicare Other

## 2020-08-02 DIAGNOSIS — K921 Melena: Secondary | ICD-10-CM

## 2020-08-02 LAB — CBC WITH DIFFERENTIAL/PLATELET
Basophils Absolute: 0.1 10*3/uL (ref 0.0–0.1)
Basophils Relative: 0.8 % (ref 0.0–3.0)
Eosinophils Absolute: 0.2 10*3/uL (ref 0.0–0.7)
Eosinophils Relative: 2.1 % (ref 0.0–5.0)
HCT: 38 % — ABNORMAL LOW (ref 39.0–52.0)
Hemoglobin: 12.8 g/dL — ABNORMAL LOW (ref 13.0–17.0)
Lymphocytes Relative: 7.8 % — ABNORMAL LOW (ref 12.0–46.0)
Lymphs Abs: 0.7 10*3/uL (ref 0.7–4.0)
MCHC: 33.6 g/dL (ref 30.0–36.0)
MCV: 94.4 fl (ref 78.0–100.0)
Monocytes Absolute: 0.7 10*3/uL (ref 0.1–1.0)
Monocytes Relative: 7.1 % (ref 3.0–12.0)
Neutro Abs: 7.5 10*3/uL (ref 1.4–7.7)
Neutrophils Relative %: 82.2 % — ABNORMAL HIGH (ref 43.0–77.0)
Platelets: 176 10*3/uL (ref 150.0–400.0)
RBC: 4.03 Mil/uL — ABNORMAL LOW (ref 4.22–5.81)
RDW: 13.2 % (ref 11.5–15.5)
WBC: 9.1 10*3/uL (ref 4.0–10.5)

## 2020-08-03 ENCOUNTER — Encounter: Payer: Self-pay | Admitting: Internal Medicine

## 2020-08-19 ENCOUNTER — Ambulatory Visit: Payer: Medicare Other | Admitting: Internal Medicine

## 2020-08-19 ENCOUNTER — Encounter: Payer: Self-pay | Admitting: Internal Medicine

## 2020-08-19 VITALS — BP 130/70 | HR 96 | Ht 72.0 in | Wt 185.0 lb

## 2020-08-19 DIAGNOSIS — A072 Cryptosporidiosis: Secondary | ICD-10-CM

## 2020-08-19 DIAGNOSIS — R194 Change in bowel habit: Secondary | ICD-10-CM

## 2020-08-19 DIAGNOSIS — K648 Other hemorrhoids: Secondary | ICD-10-CM

## 2020-08-19 DIAGNOSIS — Z86010 Personal history of colonic polyps: Secondary | ICD-10-CM | POA: Diagnosis not present

## 2020-08-19 MED ORDER — NITAZOXANIDE 500 MG PO TABS: 500.0000 mg | ORAL_TABLET | Freq: Two times a day (BID) | ORAL | 0 refills | Status: DC

## 2020-08-19 NOTE — Patient Instructions (Signed)
We have sent the following medications to your pharmacy for you to pick up at your convenience: Alina (nitrazoxanide) 500 mg twice daily x 3 days  Please purchase the following medications over the counter and take as directed: Recticare- apply to rectum 2-3 times daily as needed Florastor 250 mg twice daily x 1 month (start 1 day after the last antibiotic is taken)  If you are age 77 or older, your body mass index should be between 23-30. Your Body mass index is 25.09 kg/m. If this is out of the aforementioned range listed, please consider follow up with your Primary Care Provider.  Due to recent changes in healthcare laws, you may see the results of your imaging and laboratory studies on MyChart before your provider has had a chance to review them.  We understand that in some cases there may be results that are confusing or concerning to you. Not all laboratory results come back in the same time frame and the provider may be waiting for multiple results in order to interpret others.  Please give [place] 48 hours in order for your provider to thoroughly review all the results before contacting the office for clarification of your results.

## 2020-08-21 ENCOUNTER — Encounter: Payer: Self-pay | Admitting: Internal Medicine

## 2020-08-21 NOTE — Progress Notes (Signed)
Subjective:    Patient ID: Austin [name], male    DOB: 23-Jul-1941, 79 y.o.   MRN: [id number]  HPI Marissa Lowrey is a 79 year old male with a history of adenomatous colon polyps, diverticulosis, hemorrhoids, prostate cancer, hypertension, renal cysts who is seen in follow-up to evaluate ongoing change in bowel habit and loose stools.  He is here alone today.  He is known to me from his surveillance colonoscopies last on 08/06/2019.  This revealed a 5 mm transverse colon polyp which was removed and found to be adenomatous.  There were diverticula from the splenic flexure to the sigmoid colon.  Medium sized internal hemorrhoids were found on retroflexion.  Prior to most recent colonoscopy he had an adenoma greater than 10 mm removed.  He reports in late August of this year he had a change in bowel habits which has persisted.  Previously he would have 1-2 formed incomplete stools per day but over a period of several weeks he would have as many as 4-5 soft and at times loose stools which were more hard to pass.  This caused his hemorrhoids to be sore and swollen.  Stools were often incomplete and he had mucus in his stools on a regular basis.  He did have one episode of bright red blood after bowel movement but this has not recurred.  He did not have fever or abdominal pain.  He was treated for prostate cancer with radioactive seed implants in April 2021.  He has lost approximately 6 to 8 pounds because he reports curtailing his eating because of associated loose and more frequent stools.  His appetite has remained good and he has not had upper GI complaint such as dysphagia or odynophagia.  No nausea or vomiting.  He saw Dr. 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  Given his immunocompetent state no specific treatment was recommended at that time.  His change in bowel movements has persisted but may be somewhat better.  He is using a Lomotil around noon every  day.   Review of Systems As per HPI, otherwise negative  Current Medications, Allergies, Past Medical History, Past Surgical History, Family History and Social History were reviewed in [employer] record.     Objective:   Physical Exam BP 130/70   Pulse 96   Ht 6' (1.829 m)   Wt 185 lb (83.9 kg)   BMI 25.09 kg/m  Gen: awake, alert, NAD HEENT: anicteric, op clear CV: RRR, no mrg Pulm: CTA b/l Abd: soft, NT/ND, +BS throughout Ext: no c/c/e Neuro: nonfocal  CBC    Component Value Date/Time   WBC 9.1 08/02/2020 1452   RBC 4.03 (L) 08/02/2020 1452   HGB 12.8 (L) 08/02/2020 1452   HCT 38.0 (L) 08/02/2020 1452   PLT 176.0 08/02/2020 1452   MCV 94.4 08/02/2020 1452   MCH 31.1 01/18/2020 0803   MCHC 33.6 08/02/2020 1452   RDW 13.2 08/02/2020 1452   LYMPHSABS 0.7 08/02/2020 1452   MONOABS 0.7 08/02/2020 1452   EOSABS 0.2 08/02/2020 1452   BASOSABS 0.1 08/02/2020 1452   CMP     Component Value Date/Time   NA 139 07/12/2020 0933   K 4.6 07/12/2020 0933   CL 104 07/12/2020 0933   CO2 28 07/12/2020 0933   GLUCOSE 102 (H) 07/12/2020 0933   BUN 15 07/12/2020 0933   CREATININE 0.92 07/12/2020 0933   CALCIUM 9.5 07/12/2020 0933   PROT 7.1 07/12/2020 0933  ALBUMIN 4.4 07/12/2020 0933   AST 17 07/12/2020 0933   ALT 11 07/12/2020 0933   ALKPHOS 47 07/12/2020 0933   BILITOT 0.9 07/12/2020 0933   GFRNONAA >60 01/18/2020 0803   GFRAA >60 01/18/2020 0803       Assessment & Plan:  79 year old male with a history of adenomatous colon polyps, diverticulosis, hemorrhoids, prostate cancer, hypertension, renal cysts who is seen in follow-up to evaluate ongoing change in bowel habit and loose stools.  1.  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  However his bowel habit changes have persisted including mucus and more frequent stooling.  Based on these facts along with the  fact that he had a colonoscopy just 12 months ago, I have recommended we treat him with antibiotic therapy and then probiotic. --Nitazoxanide 500 mg twice daily x3 days --2 days after completing antibiotics begin Florastor 250 mg twice daily x1 month --I have asked that he call me in about 4 weeks to let me know how he is feeling and certainly if symptoms fail to return completely to normal for him  2.  Symptomatic internal hemorrhoids --we discussed these today and they are exacerbated by #1.  I recommended RectiCare advanced and samples provided today.  If symptoms persist we could consider hemorrhoidal banding.  3.  History of adenomatous colon polyps --last surveillance colonoscopy was last year and we had plan to defer future surveillance based on age.  This is still my recommendation but we may consider repeating colonoscopy if bowel habit change persists.  30 minutes total spent today including patient facing time, coordination of care, reviewing medical history/procedures/pertinent radiology studies, and documentation of the encounter.

## 2020-08-31 DIAGNOSIS — H16141 Punctate keratitis, right eye: Secondary | ICD-10-CM | POA: Diagnosis not present

## 2020-09-05 ENCOUNTER — Telehealth (INDEPENDENT_AMBULATORY_CARE_PROVIDER_SITE_OTHER): Payer: Medicare Other | Admitting: Internal Medicine

## 2020-09-05 ENCOUNTER — Other Ambulatory Visit: Payer: Self-pay

## 2020-09-05 ENCOUNTER — Encounter: Payer: Self-pay | Admitting: Internal Medicine

## 2020-09-05 DIAGNOSIS — R059 Cough, unspecified: Secondary | ICD-10-CM | POA: Diagnosis not present

## 2020-09-05 MED ORDER — AZITHROMYCIN 250 MG PO TABS: ORAL_TABLET | ORAL | 0 refills | Status: DC

## 2020-09-05 MED ORDER — HYDROCODONE-HOMATROPINE 5-1.5 MG/5ML PO SYRP: 5.0000 mL | ORAL_SOLUTION | Freq: Two times a day (BID) | ORAL | 0 refills | Status: DC | PRN

## 2020-09-05 NOTE — Assessment & Plan Note (Signed)
Rx azithromycin and hydrocodone cough syrup. [place] narcotic database reviewed without inappropriate fills. Advised if no improvement in 1-2 days to get covid-19 testing done. Advised to avoid traveling to family over Thanksgiving.

## 2020-09-05 NOTE — Progress Notes (Signed)
Virtual Visit via Video Note  I connected with [name] on 09/05/20 at  3:00 PM EST by a video enabled telemedicine application and verified that I am speaking with the correct person using two identifiers.  The patient and the provider were at separate locations throughout the entire encounter. Patient location: home, Provider location: work   I discussed the limitations of evaluation and management by telemedicine and the availability of in person appointments. The patient expressed understanding and agreed to proceed. The patient and the provider were the only parties present for the visit unless noted in HPI below.  History of Present Illness: The patient is a 79 y.o. man with visit for cough and congestion for 4-5 days. Has sinus congestion and chest congestion. Denies SOB on exertion. Does feel more tired than usual. Overall it is not improving. Wife was sick about 1 week prior to him. They are both vaccinated against covid-19 (2 shots no eligible for booster until December). Has tried otc cold medications but they do not help the cough. He has productive cough with green to yellow sputum which is increasing in amount and frequency.   Observations/Objective: Appearance: normal, breathing appears normal, cough during visit, casual grooming, abdomen does not appear distended, throat not well visualized, memory normal, mental status is A and O times 3  Assessment and Plan: See problem oriented charting  Follow Up Instructions: rx hydrocodone cough syrup and azithromcyin  I discussed the assessment and treatment plan with the patient. The patient was provided an opportunity to ask questions and all were answered. The patient agreed with the plan and demonstrated an understanding of the instructions.   The patient was advised to call back or seek an in-person evaluation if the symptoms worsen or if the condition fails to improve as anticipated.  Hoyt Koch, MD

## 2020-09-13 ENCOUNTER — Telehealth: Payer: Self-pay | Admitting: Internal Medicine

## 2020-09-13 NOTE — Telephone Encounter (Signed)
° °  Patient calling to report since virtual visit 11/22 he is still coughing, producing green mucus.  He has finished antibiotic and cough medicine, requesting a different antibiotic and additional cough med

## 2020-09-13 NOTE — Telephone Encounter (Signed)
Sent to Dr. John. 

## 2020-09-13 NOTE — Telephone Encounter (Signed)
If no fever, this may not be an active infection, therefore antibx would not be needed  OK for inperson OV

## 2020-09-14 MED ORDER — PROMETHAZINE-CODEINE 6.25-10 MG/5ML PO SYRP
5.0000 mL | ORAL_SOLUTION | Freq: Four times a day (QID) | ORAL | 0 refills | Status: DC | PRN
Start: 2020-09-14 — End: 2022-05-14

## 2020-09-14 NOTE — Telephone Encounter (Signed)
Ok to try change to phenergan with codein - done erx

## 2020-09-14 NOTE — Telephone Encounter (Signed)
Sent to Dr. John. 

## 2020-09-14 NOTE — Telephone Encounter (Signed)
   Patient states he doesn't have a fever. Requesting advice for OTC cough medication or new prescription for cough syrup to help with cough at night Declined appointment

## 2020-09-14 NOTE — Addendum Note (Signed)
Addended by: Biagio Borg on: 09/14/2020 04:56 PM   Modules accepted: Orders

## 2020-09-29 ENCOUNTER — Encounter: Payer: Self-pay | Admitting: Internal Medicine

## 2020-10-27 DIAGNOSIS — R3915 Urgency of urination: Secondary | ICD-10-CM | POA: Diagnosis not present

## 2020-12-29 DIAGNOSIS — H04123 Dry eye syndrome of bilateral lacrimal glands: Secondary | ICD-10-CM | POA: Diagnosis not present

## 2020-12-29 DIAGNOSIS — Z9849 Cataract extraction status, unspecified eye: Secondary | ICD-10-CM | POA: Diagnosis not present

## 2021-04-03 ENCOUNTER — Telehealth: Payer: Self-pay | Admitting: Internal Medicine

## 2021-04-03 ENCOUNTER — Encounter: Payer: Self-pay | Admitting: Internal Medicine

## 2021-04-03 ENCOUNTER — Ambulatory Visit (INDEPENDENT_AMBULATORY_CARE_PROVIDER_SITE_OTHER): Payer: Medicare Other | Admitting: Internal Medicine

## 2021-04-03 ENCOUNTER — Other Ambulatory Visit: Payer: Self-pay

## 2021-04-03 VITALS — BP 140/78 | HR 54 | Temp 98.1°F | Ht 72.0 in | Wt 181.0 lb

## 2021-04-03 DIAGNOSIS — E538 Deficiency of other specified B group vitamins: Secondary | ICD-10-CM

## 2021-04-03 DIAGNOSIS — Z0001 Encounter for general adult medical examination with abnormal findings: Secondary | ICD-10-CM | POA: Diagnosis not present

## 2021-04-03 DIAGNOSIS — I10 Essential (primary) hypertension: Secondary | ICD-10-CM | POA: Diagnosis not present

## 2021-04-03 DIAGNOSIS — R238 Other skin changes: Secondary | ICD-10-CM

## 2021-04-03 DIAGNOSIS — R233 Spontaneous ecchymoses: Secondary | ICD-10-CM

## 2021-04-03 DIAGNOSIS — J309 Allergic rhinitis, unspecified: Secondary | ICD-10-CM | POA: Diagnosis not present

## 2021-04-03 DIAGNOSIS — Z1159 Encounter for screening for other viral diseases: Secondary | ICD-10-CM

## 2021-04-03 DIAGNOSIS — E559 Vitamin D deficiency, unspecified: Secondary | ICD-10-CM

## 2021-04-03 DIAGNOSIS — R634 Abnormal weight loss: Secondary | ICD-10-CM | POA: Diagnosis not present

## 2021-04-03 DIAGNOSIS — R739 Hyperglycemia, unspecified: Secondary | ICD-10-CM

## 2021-04-03 DIAGNOSIS — C61 Malignant neoplasm of prostate: Secondary | ICD-10-CM

## 2021-04-03 LAB — URINALYSIS, ROUTINE W REFLEX MICROSCOPIC
Bilirubin Urine: NEGATIVE
Hgb urine dipstick: NEGATIVE
Ketones, ur: NEGATIVE
Leukocytes,Ua: NEGATIVE
Nitrite: NEGATIVE
RBC / HPF: NONE SEEN (ref 0–?)
Specific Gravity, Urine: 1.01 (ref 1.000–1.030)
Total Protein, Urine: NEGATIVE
Urine Glucose: NEGATIVE
Urobilinogen, UA: 0.2 (ref 0.0–1.0)
WBC, UA: NONE SEEN (ref 0–?)
pH: 6 (ref 5.0–8.0)

## 2021-04-03 LAB — LIPID PANEL
Cholesterol: 144 mg/dL (ref 0–200)
HDL: 75.1 mg/dL (ref >39.00)
LDL Cholesterol: 61 mg/dL (ref 0–99)
NonHDL: 68.49
Total CHOL/HDL Ratio: 2
Triglycerides: 37 mg/dL (ref 0.0–149.0)
VLDL: 7.4 mg/dL (ref 0.0–40.0)

## 2021-04-03 LAB — CBC WITH DIFFERENTIAL/PLATELET
Basophils Absolute: 0 10*3/uL (ref 0.0–0.1)
Basophils Relative: 0.9 % (ref 0.0–3.0)
Eosinophils Absolute: 0.2 10*3/uL (ref 0.0–0.7)
Eosinophils Relative: 5.5 % — ABNORMAL HIGH (ref 0.0–5.0)
HCT: 40.6 % (ref 39.0–52.0)
Hemoglobin: 13.8 g/dL (ref 13.0–17.0)
Lymphocytes Relative: 16.8 % (ref 12.0–46.0)
Lymphs Abs: 0.6 10*3/uL — ABNORMAL LOW (ref 0.7–4.0)
MCHC: 34 g/dL (ref 30.0–36.0)
MCV: 95.1 fl (ref 78.0–100.0)
Monocytes Absolute: 0.5 10*3/uL (ref 0.1–1.0)
Monocytes Relative: 11.7 % (ref 3.0–12.0)
Neutro Abs: 2.5 10*3/uL (ref 1.4–7.7)
Neutrophils Relative %: 65.1 % (ref 43.0–77.0)
Platelets: 149 10*3/uL — ABNORMAL LOW (ref 150.0–400.0)
RBC: 4.26 Mil/uL (ref 4.22–5.81)
RDW: 13 % (ref 11.5–15.5)
WBC: 3.9 10*3/uL — ABNORMAL LOW (ref 4.0–10.5)

## 2021-04-03 LAB — PROTIME-INR
INR: 1 ratio (ref 0.8–1.0)
Prothrombin Time: 11.4 s (ref 9.6–13.1)

## 2021-04-03 LAB — BASIC METABOLIC PANEL
BUN: 19 mg/dL (ref 6–23)
CO2: 27 mEq/L (ref 19–32)
Calcium: 9.4 mg/dL (ref 8.4–10.5)
Chloride: 104 mEq/L (ref 96–112)
Creatinine, Ser: 0.82 mg/dL (ref 0.40–1.50)
GFR: 84.66 mL/min (ref >60.00)
Glucose, Bld: 102 mg/dL — ABNORMAL HIGH (ref 70–99)
Potassium: 4.2 mEq/L (ref 3.5–5.1)
Sodium: 140 mEq/L (ref 135–145)

## 2021-04-03 LAB — VITAMIN B12: Vitamin B-12: 746 pg/mL (ref 211–911)

## 2021-04-03 LAB — TSH: TSH: 2.03 u[IU]/mL (ref 0.35–4.50)

## 2021-04-03 LAB — HEPATIC FUNCTION PANEL
ALT: 14 U/L (ref 0–53)
AST: 19 U/L (ref 0–37)
Albumin: 4.6 g/dL (ref 3.5–5.2)
Alkaline Phosphatase: 48 U/L (ref 39–117)
Bilirubin, Direct: 0.2 mg/dL (ref 0.0–0.3)
Total Bilirubin: 0.8 mg/dL (ref 0.2–1.2)
Total Protein: 7.1 g/dL (ref 6.0–8.3)

## 2021-04-03 LAB — HEMOGLOBIN A1C: Hgb A1c MFr Bld: 5.5 % (ref 4.6–6.5)

## 2021-04-03 LAB — VITAMIN D 25 HYDROXY (VIT D DEFICIENCY, FRACTURES): VITD: 39.97 ng/mL (ref 30.00–100.00)

## 2021-04-03 MED ORDER — RAMIPRIL 5 MG PO CAPS: 5.0000 mg | ORAL_CAPSULE | Freq: Every day | ORAL | 3 refills | Status: DC

## 2021-04-03 NOTE — Progress Notes (Signed)
Patient ID: Austin [name], male   DOB: 1941/07/25, 80 y.o.   MRN: [id number]         Chief Complaint:: wellness exam and allergies with cough, wt loss, low vit d and b12, hyperglycemia, htn, easy bruising, prostate ca       HPI:  Austin [name] is a 80 y.o. male here for wellness exam; declines shingrix vax, o/w up to date with preventive referrals and immunizations                        Also Does have several wks ongoing nasal allergy symptoms with clearish congestion, itch and sneezing, without fever, pain, ST, swelling or wheezing but has cough with post nasal gtt.   Lost wt iwit less appetite, just doesn't like certain foods he used to, such as chocalate that can cause GI upset.  Still taking probiotic.  Taking B12 and Vit D.  Pt denies chest pain, increased sob or doe, wheezing, orthopnea, PND, increased LE swelling, palpitations, dizziness or syncope.   Pt denies polydipsia, polyuria, or new focal neuro s/s.  Also has unsuual easy bruising it seems in the past few months.  Not taking anticoagulant.  Has f/u appt with urology Dr Lovena Neighbours next month with psa there.   Wt Readings from Last 3 Encounters:  04/03/21 181 lb (82.1 kg)  08/19/20 185 lb (83.9 kg)  07/12/20 190 lb (86.2 kg)   BP Readings from Last 3 Encounters:  04/03/21 140/78  08/19/20 130/70  07/12/20 140/72   Immunization History  Administered Date(s) Administered   Fluad Quad(high Dose 65+) 06/17/2019   Influenza Split 08/13/2011, 07/21/2012   Influenza, High Dose Seasonal PF 08/14/2017, 08/07/2018   Influenza,inj,Quad PF,6+ Mos 07/22/2013, 08/31/2014   Influenza-Unspecified 09/29/2015, 08/22/2016, 08/14/2017, 08/07/2018, 09/13/2020   PFIZER(Purple Top)SARS-COV-2 Vaccination 03/06/2020, 03/28/2020, 09/28/2020   Pneumococcal Conjugate-13 07/22/2013   Pneumococcal Polysaccharide-23 06/17/2007, 07/21/2012   Td 11/23/1999, 06/29/2010   Tdap 02/22/2020   Zoster, Live 08/31/2014   There are no preventive care reminders to  display for this patient.     Past Medical History:  Diagnosis Date   Allergy    Aortic atherosclerosis ([place])    Cataract    forming left eye    Cholelithiasis    Hepatic steatosis    History of kidney stones    History of nephrolithiasis    Hypertension    Inguinal hernia    Internal hemorrhoids    Low back pain    Prostate cancer ([facility])    Renal cyst    Tubular adenoma of colon    Past Surgical History:  Procedure Laterality Date   COLONOSCOPY     HEMORRHOID SURGERY  age 73   POLYPECTOMY     PROSTATE BIOPSY     RADIOACTIVE SEED IMPLANT N/A 01/20/2020   Procedure: RADIOACTIVE SEED IMPLANT/BRACHYTHERAPY IMPLANT;  Surgeon: Ceasar Mons, MD;  Location: [facility];  Service: Urology;  Laterality: N/A;   SPACE OAR INSTILLATION N/A 01/20/2020   Procedure: SPACE OAR INSTILLATION;  Surgeon: Ceasar Mons, MD;  Location: [facility];  Service: Urology;  Laterality: N/A;    reports that he quit smoking about 56 years ago. His smoking use included cigarettes. He has a 1.75 pack-year smoking history. He has never used smokeless tobacco. He reports current alcohol use. He reports that he does not use drugs. family history includes COPD in an other family member; Cancer in an other family member;  Colon polyps in his maternal uncle; Diabetes in his mother; Heart disease in an other family member; Liver cancer (age of onset: 54) in his father; Psoriasis in an other family member. Allergies  Allergen Reactions   Iohexol Hives   Ivp Dye [Iodinated Diagnostic Agents] Hives   Current Outpatient Medications on File Prior to Visit  Medication Sig Dispense Refill   cyanocobalamin 1000 MCG tablet Take 1,000 mcg by mouth daily.     tamsulosin (FLOMAX) 0.4 MG CAPS capsule Take 0.4 mg by mouth daily.     aspirin 81 MG tablet Take 81 mg by mouth daily.   (Patient not taking: Reported on 04/03/2021)     azithromycin (ZITHROMAX) 250 MG tablet Day 1  take 2 pills, days 2-5 take 1 pill daily (Patient not taking: Reported on 04/03/2021) 6 tablet 0   Cholecalciferol 50 MCG (2000 UT) CAPS Take by mouth. (Patient not taking: Reported on 04/03/2021)     diclofenac Sodium (VOLTAREN) 1 % GEL diclofenac 1 % topical gel (Patient not taking: Reported on 04/03/2021)     diphenoxylate-atropine (LOMOTIL) 2.5-0.025 MG tablet Take 1 tablet by mouth 4 (four) times daily as needed for diarrhea or loose stools. (Patient not taking: Reported on 04/03/2021) 40 tablet 0   fluocinolone (SYNALAR) 0.01 % external solution SMARTSIG:Sparingly Topical Twice Daily PRN (Patient not taking: Reported on 04/03/2021)     nitazoxanide (ALINIA) 500 MG tablet Take 1 tablet (500 mg total) by mouth 2 (two) times daily with a meal. (Patient not taking: Reported on 04/03/2021) 6 tablet 0   promethazine-codeine (PHENERGAN WITH CODEINE) 6.25-10 MG/5ML syrup Take 5 mLs by mouth every 6 (six) hours as needed for cough. (Patient not taking: Reported on 04/03/2021) 180 mL 0   No current facility-administered medications on file prior to visit.        ROS:  All others reviewed and negative.  Objective        PE:  BP 140/78 (BP Location: Left Arm, Patient Position: Sitting, Cuff Size: Normal)   Pulse (!) 54   Temp 98.1 F (36.7 C) (Oral)   Ht 6' (1.829 m)   Wt 181 lb (82.1 kg)   SpO2 98%   BMI 24.55 kg/m                 Constitutional: Pt appears in NAD               HENT: Head: NCAT.                Right Ear: External ear normal.                 Left Ear: External ear normal.                Eyes: . Pupils are equal, round, and reactive to light. Conjunctivae and EOM are normal               Nose: without d/c or deformity               Neck: Neck supple. Gross normal ROM               Cardiovascular: Normal rate and regular rhythm.                 Pulmonary/Chest: Effort normal and breath sounds without rales or wheezing.                Abd:  Soft, NT, ND, + BS, no organomegaly  Neurological: Pt is alert. At baseline orientation, motor grossly intact               Skin: Skin is warm. No rashes, no other new lesions, LE edema - none               Psychiatric: Pt behavior is normal without agitation   Micro: none  Cardiac tracings I have personally interpreted today:  none  Pertinent Radiological findings (summarize): none   Lab Results  Component Value Date   WBC 3.9 (L) 04/03/2021   HGB 13.8 04/03/2021   HCT 40.6 04/03/2021   PLT 149.0 (L) 04/03/2021   GLUCOSE 102 (H) 04/03/2021   CHOL 144 04/03/2021   TRIG 37.0 04/03/2021   HDL 75.10 04/03/2021   LDLCALC 61 04/03/2021   ALT 14 04/03/2021   AST 19 04/03/2021   NA 140 04/03/2021   K 4.2 04/03/2021   CL 104 04/03/2021   CREATININE 0.82 04/03/2021   BUN 19 04/03/2021   CO2 27 04/03/2021   TSH 2.03 04/03/2021   PSA 5.07 (H) 06/17/2019   INR 1.0 04/03/2021   HGBA1C 5.5 04/03/2021   Assessment/Plan:  KINGSLEE MAIRENA is a 80 y.o. White or Caucasian [1] male with  has a past medical history of Allergy, Aortic atherosclerosis ([place]), Cataract, Cholelithiasis, Hepatic steatosis, History of kidney stones, History of nephrolithiasis, Hypertension, Inguinal hernia, Internal hemorrhoids, Low back pain, Prostate cancer ([place]), Renal cyst, and Tubular adenoma of colon.  Malignant neoplasm of prostate [facility]) For f/u urology next mo with psa  Encounter for well adult exam with abnormal findings Age and sex appropriate education and counseling updated with regular exercise and diet Referrals for preventative services - none needed Immunizations addressed - declines shingrix Smoking counseling  - none needed Evidence for depression or other mood disorder - none significant Most recent labs reviewed. I have personally reviewed and have noted: 1) the patient's medical and social history 2) The patient's current medications and supplements 3) The patient's height, weight, and BMI have been recorded in the  chart   Allergic rhinitis Overall mild with post nasal gtt cough - for allegra and nasacort otc prn  B12 deficiency Lab Results  Component Value Date   VITAMINB12 746 04/03/2021   Stable, cont oral replacement - b12 1000 mcg qd   Essential hypertension BP Readings from Last 3 Encounters:  04/03/21 140/78  08/19/20 130/70  07/12/20 140/72   Stable, pt to continue medical treatment altace   Hyperglycemia Lab Results  Component Value Date   HGBA1C 5.5 04/03/2021   Stable, pt to continue current medical treatment  - diet   Vitamin D deficiency Last vitamin D Lab Results  Component Value Date   VD25OH 39.97 04/03/2021   Stable, cont oral replacement   Weight loss D/w pt, etiology unclear, declines cxr with cough,  to f/u any worsening symptoms or concerns   Easy bruising With worsening in last few months, for INR with labs  Followup: Return in about 1 year (around 04/03/2022).  Cathlean Cower, MD 04/05/2021 10:32 PM [facility]

## 2021-04-03 NOTE — Patient Instructions (Addendum)
Please check with your insurance about the Shingrix shot  Please continue all other medications as before, and refills have been done if requested.  Please have the pharmacy call with any other refills you may need.  Please continue your efforts at being more active, low cholesterol diet, and weight control.  You are otherwise up to date with prevention measures today.  Please keep your appointments with your specialists as you may have planned  Please go to the LAB at the blood drawing area for the tests to be done  You will be contacted by phone if any changes need to be made immediately.  Otherwise, you will receive a letter about your results with an explanation, but please check with MyChart first.  Please remember to sign up for MyChart if you have not done so, as this will be important to you in the future with finding out test results, communicating by private email, and scheduling acute appointments online when needed.  Please make an Appointment to return for your 1 year visit, or sooner if needed

## 2021-04-03 NOTE — Telephone Encounter (Signed)
Patient is requesting ramipril (ALTACE) 5 MG capsule be sent to [facility]. Please advise

## 2021-04-04 LAB — HEPATITIS C ANTIBODY
Hepatitis C Ab: NONREACTIVE
SIGNAL TO CUT-OFF: 0.01 (ref <1.00)

## 2021-04-05 ENCOUNTER — Encounter: Payer: Self-pay | Admitting: Internal Medicine

## 2021-04-05 DIAGNOSIS — R634 Abnormal weight loss: Secondary | ICD-10-CM | POA: Insufficient documentation

## 2021-04-05 DIAGNOSIS — R233 Spontaneous ecchymoses: Secondary | ICD-10-CM | POA: Insufficient documentation

## 2021-04-05 NOTE — Assessment & Plan Note (Signed)
BP Readings from Last 3 Encounters:  04/03/21 140/78  08/19/20 130/70  07/12/20 140/72   Stable, pt to continue medical treatment altace

## 2021-04-05 NOTE — Assessment & Plan Note (Signed)
For f/u urology next mo with psa

## 2021-04-05 NOTE — Assessment & Plan Note (Signed)
Lab Results  Component Value Date   HGBA1C 5.5 04/03/2021   Stable, pt to continue current medical treatment  - diet

## 2021-04-05 NOTE — Assessment & Plan Note (Signed)
Overall mild with post nasal gtt cough - for allegra and nasacort otc prn

## 2021-04-05 NOTE — Assessment & Plan Note (Signed)
D/w pt, etiology unclear, declines cxr with cough,  to f/u any worsening symptoms or concerns

## 2021-04-05 NOTE — Assessment & Plan Note (Signed)
Last vitamin D Lab Results  Component Value Date   VD25OH 39.97 04/03/2021   Stable, cont oral replacement

## 2021-04-05 NOTE — Assessment & Plan Note (Signed)
With worsening in last few months, for INR with labs

## 2021-04-05 NOTE — Assessment & Plan Note (Signed)

## 2021-04-05 NOTE — Assessment & Plan Note (Signed)
Lab Results  Component Value Date   VITAMINB12 746 04/03/2021   Stable, cont oral replacement - b12 1000 mcg qd  

## 2021-04-18 ENCOUNTER — Encounter: Payer: Self-pay | Admitting: Internal Medicine

## 2021-04-18 MED ORDER — KETOCONAZOLE 2 % EX CREA: 1.0000 "application " | TOPICAL_CREAM | Freq: Every day | CUTANEOUS | 0 refills | Status: DC

## 2021-04-21 ENCOUNTER — Ambulatory Visit (INDEPENDENT_AMBULATORY_CARE_PROVIDER_SITE_OTHER): Payer: Medicare Other

## 2021-04-21 DIAGNOSIS — Z0000 Encounter for general adult medical examination without abnormal findings: Secondary | ICD-10-CM

## 2021-04-21 DIAGNOSIS — R300 Dysuria: Secondary | ICD-10-CM | POA: Diagnosis not present

## 2021-04-21 DIAGNOSIS — R3915 Urgency of urination: Secondary | ICD-10-CM | POA: Diagnosis not present

## 2021-04-21 NOTE — Patient Instructions (Addendum)
Mr. Austin [name] , Thank you for taking time to come for your Medicare Wellness Visit. I appreciate your ongoing commitment to your health goals. Please review the following plan we discussed and let me know if I can assist you in the future.   Screening recommendations/referrals: Colonoscopy: last done 08/06/2019; no repeat due to age Recommended yearly ophthalmology/optometry visit for glaucoma screening and checkup Recommended yearly dental visit for hygiene and checkup  Vaccinations: Influenza vaccine: 09/13/2020 Pneumococcal vaccine: 07/21/2012, 07/22/2013 Tdap vaccine: 02/22/2020; due every 10 years Shingles vaccine: Please call your insurance company to determine your out of pocket expense for the Shingrix vaccine. You may receive this vaccine at your local pharmacy.   Covid-19: 03/06/2020, 03/28/2020, 12/15/221  Advanced directives: Advance directive discussed with you today. Even though you declined this today please call our office should you change your mind and we can give you the proper paperwork for you to fill out.  Conditions/risks identified: Client understands the importance of follow-up with providers by attending scheduled visits and discussed goals to eat healthier, increase physical activity, exercise the brain, socialize more get enough rest and make time for laughter.  Next appointment: Please schedule your next Medicare Wellness Visit with your Nurse Health Advisor in 1 year by calling [phone].  Preventive Care 70 Years and Older, Male Preventive care refers to lifestyle choices and visits with your health care provider that can promote health and wellness. What does preventive care include? A yearly physical exam. This is also called an annual well check. Dental exams once or twice a year. Routine eye exams. Ask your health care provider how often you should have your eyes checked. Personal lifestyle choices, including: Daily care of your teeth and gums. Regular  physical activity. Eating a healthy diet. Avoiding tobacco and drug use. Limiting alcohol use. Practicing safe sex. Taking low doses of aspirin every day. Taking vitamin and mineral supplements as recommended by your health care provider. What happens during an annual well check? The services and screenings done by your health care provider during your annual well check will depend on your age, overall health, lifestyle risk factors, and family history of disease. Counseling  Your health care provider may ask you questions about your: Alcohol use. Tobacco use. Drug use. Emotional well-being. Home and relationship well-being. Sexual activity. Eating habits. History of falls. Memory and ability to understand (cognition). Work and work Statistician. Screening  You may have the following tests or measurements: Height, weight, and BMI. Blood pressure. Lipid and cholesterol levels. These may be checked every 5 years, or more frequently if you are over 42 years old. Skin check. Lung cancer screening. You may have this screening every year starting at age 13 if you have a 30-pack-year history of smoking and currently smoke or have quit within the past 15 years. Fecal occult blood test (FOBT) of the stool. You may have this test every year starting at age 39. Flexible sigmoidoscopy or colonoscopy. You may have a sigmoidoscopy every 5 years or a colonoscopy every 10 years starting at age 66. Prostate cancer screening. Recommendations will vary depending on your family history and other risks. Hepatitis C blood test. Hepatitis B blood test. Sexually transmitted disease (STD) testing. Diabetes screening. This is done by checking your blood sugar (glucose) after you have not eaten for a while (fasting). You may have this done every 1-3 years. Abdominal aortic aneurysm (AAA) screening. You may need this if you are a current or former smoker. Osteoporosis. You may be  screened starting at age 56  if you are at high risk. Talk with your health care provider about your test results, treatment options, and if necessary, the need for more tests. Vaccines  Your health care provider may recommend certain vaccines, such as: Influenza vaccine. This is recommended every year. Tetanus, diphtheria, and acellular pertussis (Tdap, Td) vaccine. You may need a Td booster every 10 years. Zoster vaccine. You may need this after age 94. Pneumococcal 13-valent conjugate (PCV13) vaccine. One dose is recommended after age 51. Pneumococcal polysaccharide (PPSV23) vaccine. One dose is recommended after age 61. Talk to your health care provider about which screenings and vaccines you need and how often you need them. This information is not intended to replace advice given to you by your health care provider. Make sure you discuss any questions you have with your health care provider. Document Released: 10/28/2015 Document Revised: 06/20/2016 Document Reviewed: 08/02/2015 Elsevier Interactive Patient Education  2017 [place] Prevention in the Home Falls can cause injuries. They can happen to people of all ages. There are many things you can do to make your home safe and to help prevent falls. What can I do on the outside of my home? Regularly fix the edges of walkways and driveways and fix any cracks. Remove anything that might make you trip as you walk through a door, such as a raised step or threshold. Trim any bushes or trees on the path to your home. Use bright outdoor lighting. Clear any walking paths of anything that might make someone trip, such as rocks or tools. Regularly check to see if handrails are loose or broken. Make sure that both sides of any steps have handrails. Any raised decks and porches should have guardrails on the edges. Have any leaves, snow, or ice cleared regularly. Use sand or salt on walking paths during winter. Clean up any spills in your garage right away. This  includes oil or grease spills. What can I do in the bathroom? Use night lights. Install grab bars by the toilet and in the tub and shower. Do not use towel bars as grab bars. Use non-skid mats or decals in the tub or shower. If you need to sit down in the shower, use a plastic, non-slip stool. Keep the floor dry. Clean up any water that spills on the floor as soon as it happens. Remove soap buildup in the tub or shower regularly. Attach bath mats securely with double-sided non-slip rug tape. Do not have throw rugs and other things on the floor that can make you trip. What can I do in the bedroom? Use night lights. Make sure that you have a light by your bed that is easy to reach. Do not use any sheets or blankets that are too big for your bed. They should not hang down onto the floor. Have a firm chair that has side arms. You can use this for support while you get dressed. Do not have throw rugs and other things on the floor that can make you trip. What can I do in the kitchen? Clean up any spills right away. Avoid walking on wet floors. Keep items that you use a lot in easy-to-reach places. If you need to reach something above you, use a strong step stool that has a grab bar. Keep electrical cords out of the way. Do not use floor polish or wax that makes floors slippery. If you must use wax, use non-skid floor wax. Do not have  throw rugs and other things on the floor that can make you trip. What can I do with my stairs? Do not leave any items on the stairs. Make sure that there are handrails on both sides of the stairs and use them. Fix handrails that are broken or loose. Make sure that handrails are as long as the stairways. Check any carpeting to make sure that it is firmly attached to the stairs. Fix any carpet that is loose or worn. Avoid having throw rugs at the top or bottom of the stairs. If you do have throw rugs, attach them to the floor with carpet tape. Make sure that you  have a light switch at the top of the stairs and the bottom of the stairs. If you do not have them, ask someone to add them for you. What else can I do to help prevent falls? Wear shoes that: Do not have high heels. Have rubber bottoms. Are comfortable and fit you well. Are closed at the toe. Do not wear sandals. If you use a stepladder: Make sure that it is fully opened. Do not climb a closed stepladder. Make sure that both sides of the stepladder are locked into place. Ask someone to hold it for you, if possible. Clearly mark and make sure that you can see: Any grab bars or handrails. First and last steps. Where the edge of each step is. Use tools that help you move around (mobility aids) if they are needed. These include: Canes. Walkers. Scooters. Crutches. Turn on the lights when you go into a dark area. Replace any light bulbs as soon as they burn out. Set up your furniture so you have a clear path. Avoid moving your furniture around. If any of your floors are uneven, fix them. If there are any pets around you, be aware of where they are. Review your medicines with your doctor. Some medicines can make you feel dizzy. This can increase your chance of falling. Ask your doctor what other things that you can do to help prevent falls. This information is not intended to replace advice given to you by your health care provider. Make sure you discuss any questions you have with your health care provider. Document Released: 07/28/2009 Document Revised: 03/08/2016 Document Reviewed: 11/05/2014 Elsevier Interactive Patient Education  2017 [employer].

## 2021-04-21 NOTE — Progress Notes (Addendum)
I connected with [name] today by telephone and verified that I am speaking with the correct person using two identifiers. Location patient: home Location provider: work Persons participating in the virtual visit: Bharat Antillon and Lisette Abu, LPN.   I discussed the limitations, risks, security and privacy concerns of performing an evaluation and management service by telephone and the availability of in person appointments. I also discussed with the patient that there may be a patient responsible charge related to this service. The patient expressed understanding and verbally consented to this telephonic visit.    Interactive audio and video telecommunications were attempted between this provider and patient, however failed, due to patient having technical difficulties OR patient did not have access to video capability.  We continued and completed visit with audio only.  Some vital signs may be absent or patient reported.   Time Spent with patient on telephone encounter: 30 minutes  Subjective:   Austin [name] is a 80 y.o. male who presents for Medicare Annual/Subsequent preventive examination.  Review of Systems     Cardiac Risk Factors include: advanced age (>49men, >58 women);hypertension;male gender     Objective:    Today's Vitals   04/21/21 0907  PainSc: 3    There is no height or weight on file to calculate BMI.  Advanced Directives 04/21/2021 01/20/2020 10/27/2019 01/19/2016 01/19/2016  Does Patient Have a Medical Advance Directive? No Yes Yes No Yes  Type of Advance Directive - [place];Living will [place];Living will - [place];Living will  Does patient want to make changes to medical advance directive? - No - Patient declined No - Patient declined No - Patient declined No - Patient declined  Copy of [place] in Chart? - No - copy requested No - copy requested - No - copy requested   Would patient like information on creating a medical advance directive? No - Patient declined - - No - patient declined information -    Current Medications (verified) Outpatient Encounter Medications as of 04/21/2021  Medication Sig   cyanocobalamin 1000 MCG tablet Take 1,000 mcg by mouth daily.   ketoconazole (NIZORAL) 2 % cream Apply 1 application topically daily.   ramipril (ALTACE) 5 MG capsule Take 1 capsule (5 mg total) by mouth daily.   aspirin 81 MG tablet Take 81 mg by mouth daily.   (Patient not taking: Reported on 04/03/2021)   azithromycin (ZITHROMAX) 250 MG tablet Day 1 take 2 pills, days 2-5 take 1 pill daily (Patient not taking: Reported on 04/03/2021)   Cholecalciferol 50 MCG (2000 UT) CAPS Take by mouth. (Patient not taking: Reported on 04/03/2021)   diclofenac Sodium (VOLTAREN) 1 % GEL diclofenac 1 % topical gel (Patient not taking: Reported on 04/03/2021)   diphenoxylate-atropine (LOMOTIL) 2.5-0.025 MG tablet Take 1 tablet by mouth 4 (four) times daily as needed for diarrhea or loose stools. (Patient not taking: Reported on 04/03/2021)   fluocinolone (SYNALAR) 0.01 % external solution SMARTSIG:Sparingly Topical Twice Daily PRN (Patient not taking: Reported on 04/03/2021)   nitazoxanide (ALINIA) 500 MG tablet Take 1 tablet (500 mg total) by mouth 2 (two) times daily with a meal. (Patient not taking: Reported on 04/03/2021)   promethazine-codeine (PHENERGAN WITH CODEINE) 6.25-10 MG/5ML syrup Take 5 mLs by mouth every 6 (six) hours as needed for cough. (Patient not taking: Reported on 04/03/2021)   tamsulosin (FLOMAX) 0.4 MG CAPS capsule Take 0.4 mg by mouth daily. (Patient not taking: Reported  on 04/21/2021)   No facility-administered encounter medications on file as of 04/21/2021.    Allergies (verified) Iohexol and Ivp dye [iodinated diagnostic agents]   History: Past Medical History:  Diagnosis Date   Allergy    Aortic atherosclerosis ([place])    Cataract    forming left eye     Cholelithiasis    Hepatic steatosis    History of kidney stones    History of nephrolithiasis    Hypertension    Inguinal hernia    Internal hemorrhoids    Low back pain    Prostate cancer (HCC)    Renal cyst    Tubular adenoma of colon    Past Surgical History:  Procedure Laterality Date   COLONOSCOPY     HEMORRHOID SURGERY  age 89   POLYPECTOMY     PROSTATE BIOPSY     RADIOACTIVE SEED IMPLANT N/A 01/20/2020   Procedure: RADIOACTIVE SEED IMPLANT/BRACHYTHERAPY IMPLANT;  Surgeon: Ceasar Mons, MD;  Location: [facility];  Service: Urology;  Laterality: N/A;   SPACE OAR INSTILLATION N/A 01/20/2020   Procedure: SPACE OAR INSTILLATION;  Surgeon: Ceasar Mons, MD;  Location: [facility];  Service: Urology;  Laterality: N/A;   Family History  Problem Relation Age of Onset   Diabetes Mother    Liver cancer Father 7   COPD Other    Cancer Other        Renal Grandfather   Heart disease Other        CAD   Psoriasis Other    Colon polyps Maternal Uncle    Esophageal cancer Neg Hx    Pancreatic cancer Neg Hx    Prostate cancer Neg Hx    Rectal cancer Neg Hx    Stomach cancer Neg Hx    Colon cancer Neg Hx    Social History   Socioeconomic History   Marital status: Married    Spouse name: Not on file   Number of children: 2   Years of education: Not on file   Highest education level: Not on file  Occupational History   Occupation: [employer]  Tobacco Use   Smoking status: Former    Packs/day: 0.25    Years: 7.00    Pack years: 1.75    Types: Cigarettes    Quit date: 10/15/1964    Years since quitting: 56.5   Smokeless tobacco: Never   Tobacco comments:    quit 40+ yrs ago  Vaping Use   Vaping Use: Never used  Substance and Sexual Activity   Alcohol use: Yes    Alcohol/week: 0.0 standard drinks    Comment: glass of wine seldom   Drug use: Never   Sexual activity: Not Currently  Other Topics Concern   Not on file   Social History Narrative   Not on file   Social Determinants of Health   Financial Resource Strain: Low Risk    Difficulty of Paying Living Expenses: Not hard at all  Food Insecurity: No Food Insecurity   Worried About Charity fundraiser in the Last Year: Never true   [place] in the Last Year: Never true  Transportation Needs: No Transportation Needs   Lack of Transportation (Medical): No   Lack of Transportation (Non-Medical): No  Physical Activity: Sufficiently Active   Days of Exercise per Week: 5 days   Minutes of Exercise per Session: 30 min  Stress: No Stress Concern Present   Feeling of Stress :  Not at all  Social Connections: Moderately Isolated   Frequency of Communication with Friends and Family: More than three times a week   Frequency of Social Gatherings with Friends and Family: Once a week   Attends Religious Services: Never   Marine scientist or Organizations: No   Attends Music therapist: Never   Marital Status: Married    Tobacco Counseling Counseling given: Not Answered Tobacco comments: quit 40+ yrs ago   Clinical Intake:  Pre-visit preparation completed: Yes  Pain : 0-10 Pain Score: 3  Pain Type: Acute pain Pain Location: Other (Comment) (Urinary tract infection) Pain Descriptors / Indicators: Discomfort Pain Onset: In the past 7 days Pain Relieving Factors: OTC medication  Pain Relieving Factors: OTC medication  Nutritional Risks: None Diabetes: No  How often do you need to have someone help you when you read instructions, pamphlets, or other written materials from your doctor or pharmacy?: 1 - Never What is the last grade level you completed in school?: Associate's Degree  Diabetic? no  Interpreter Needed?: No  Information entered by :: Lisette Abu, LPN   Activities of Daily Living In your present state of health, do you have any difficulty performing the following activities: 04/21/2021 04/03/2021   Hearing? N N  Vision? N N  Difficulty concentrating or making decisions? Tempie Donning  Walking or climbing stairs? N N  Dressing or bathing? N N  Doing errands, shopping? N N  Preparing Food and eating ? N -  Using the Toilet? N -  In the past six months, have you accidently leaked urine? N -  Do you have problems with loss of bowel control? N -  Managing your Medications? N -  Managing your Finances? N -  Housekeeping or managing your Housekeeping? N -  Some recent data might be hidden    Patient Care Team: Biagio Borg, MD as PCP - General Lovena Neighbours Conception Oms, MD as Consulting Physician (Urology) Tyler Pita, MD as Consulting Physician (Radiation Oncology) Delsa Sale, OD as Consulting Physician (Optometry)  Indicate any recent Medical Services you may have received from other than [facility] providers in the past year (date may be approximate).     Assessment:   This is a routine wellness examination for Mykal.  Hearing/Vision screen Hearing Screening - Comments:: Patient denied any hearing difficulty. No hearing aids. Vision Screening - Comments:: Patient does not wear corrective lenses.  Eye exam done once a year by Dr. Teodoro Spray.  Dietary issues and exercise activities discussed: Current Exercise Habits: Home exercise routine, Type of exercise: walking;Other - see comments (yard work and home maintenance), Time (Minutes): 30, Frequency (Times/Week): 5, Weekly Exercise (Minutes/Week): 150, Intensity: Moderate, Exercise limited by: None identified   Goals Addressed             This Visit's Progress    Client understands the importance of follow-up with providers by attending scheduled visits        Depression Screen PHQ 2/9 Scores 04/21/2021 04/03/2021 04/03/2021 02/22/2020 02/22/2020 06/17/2019 01/09/2018  PHQ - 2 Score 0 0 0 0 0 0 0  PHQ- 9 Score - - - - - - 0    Fall Risk Fall Risk  04/21/2021 04/03/2021 04/03/2021 02/22/2020 02/22/2020  Falls in the past year? 0 0  0 0 0  Number falls in past yr: 0 0 0 - 0  Injury with Fall? 0 0 0 - 0  Risk for fall due to : No Fall  Risks - No Fall Risks - No Fall Risks  Follow up Falls evaluation completed - - - Falls evaluation completed    FALL RISK PREVENTION PERTAINING TO THE HOME:  Any stairs in or around the home? Yes  If so, are there any without handrails? No  Home free of loose throw rugs in walkways, pet beds, electrical cords, etc? Yes  Adequate lighting in your home to reduce risk of falls? Yes   ASSISTIVE DEVICES UTILIZED TO PREVENT FALLS:  Life alert? No  Use of a cane, walker or w/c? No  Grab bars in the bathroom? No  Shower chair or bench in shower? No  Elevated toilet seat or a handicapped toilet? No   TIMED UP AND GO:  Was the test performed? No .  Length of time to ambulate 10 feet: 0  sec.   Gait steady and fast without use of assistive device  Cognitive Function: Normal cognitive status assessed by direct observation by this Nurse Health Advisor. No abnormalities found.          Immunizations Immunization History  Administered Date(s) Administered   Fluad Quad(high Dose 65+) 06/17/2019   Influenza Split 08/13/2011, 07/21/2012   Influenza, High Dose Seasonal PF 08/14/2017, 08/07/2018   Influenza,inj,Quad PF,6+ Mos 07/22/2013, 08/31/2014   Influenza-Unspecified 09/29/2015, 08/22/2016, 08/14/2017, 08/07/2018, 09/13/2020   PFIZER(Purple Top)SARS-COV-2 Vaccination 03/06/2020, 03/28/2020, 09/28/2020   Pneumococcal Conjugate-13 07/22/2013   Pneumococcal Polysaccharide-23 06/17/2007, 07/21/2012   Td 11/23/1999, 06/29/2010   Tdap 02/22/2020   Zoster, Live 08/31/2014    TDAP status: Up to date  Flu Vaccine status: Up to date  Pneumococcal vaccine status: Up to date  Covid-19 vaccine status: Completed vaccines  Qualifies for Shingles Vaccine? Yes   Zostavax completed Yes   Shingrix Completed?: No.    Education has been provided regarding the importance of this vaccine.  Patient has been advised to call insurance company to determine out of pocket expense if they have not yet received this vaccine. Advised may also receive vaccine at local pharmacy or Health Dept. Verbalized acceptance and understanding.  Screening Tests Health Maintenance  Topic Date Due   Zoster Vaccines- Shingrix (1 of 2) 07/04/2021 (Originally 08/19/1960)   INFLUENZA VACCINE  05/15/2021   TETANUS/TDAP  02/21/2030   Hepatitis C Screening  Completed   PNA vac Low Risk Adult  Completed   HPV VACCINES  Aged Out   COVID-19 Vaccine  Discontinued    Health Maintenance  There are no preventive care reminders to display for this patient.  Colorectal cancer screening: No longer required.   Lung Cancer Screening: (Low Dose CT Chest recommended if Age 11-80 years, 30 pack-year currently smoking OR have quit w/in 15years.) does not qualify.   Lung Cancer Screening Referral: no  Additional Screening:  Hepatitis C Screening: does qualify; Completed yes  Vision Screening: Recommended annual ophthalmology exams for early detection of glaucoma and other disorders of the eye. Is the patient up to date with their annual eye exam?  Yes  Who is the provider or what is the name of the office in which the patient attends annual eye exams? [facility] If pt is not established with a provider, would they like to be referred to a provider to establish care? No .   Dental Screening: Recommended annual dental exams for proper oral hygiene  Community Resource Referral / Chronic Care Management: CRR required this visit?  No   CCM required this visit?  No      Plan:  I have personally reviewed and noted the following in the patient's chart:   Medical and social history Use of alcohol, tobacco or illicit drugs  Current medications and supplements including opioid prescriptions. Patient is not currently taking opioid prescriptions. Functional ability and status Nutritional  status Physical activity Advanced directives List of other physicians Hospitalizations, surgeries, and ER visits in previous 12 months Vitals Screenings to include cognitive, depression, and falls Referrals and appointments  In addition, I have reviewed and discussed with patient certain preventive protocols, quality metrics, and best practice recommendations. A written personalized care plan for preventive services as well as general preventive health recommendations were provided to patient.     Sheral Flow, LPN   10/23/120   Nurse Notes:  Patient is cogitatively intact. There were no vitals filed for this visit. There is no height or weight on file to calculate BMI. Patient stated that he has no issues with gait or balance; does not use any assistive devices.

## 2021-05-22 DIAGNOSIS — M5136 Other intervertebral disc degeneration, lumbar region: Secondary | ICD-10-CM | POA: Diagnosis not present

## 2021-05-26 DIAGNOSIS — R300 Dysuria: Secondary | ICD-10-CM | POA: Diagnosis not present

## 2021-05-26 DIAGNOSIS — R3915 Urgency of urination: Secondary | ICD-10-CM | POA: Diagnosis not present

## 2021-05-30 DIAGNOSIS — M545 Low back pain, unspecified: Secondary | ICD-10-CM | POA: Diagnosis not present

## 2021-08-23 ENCOUNTER — Encounter: Payer: Self-pay | Admitting: Internal Medicine

## 2021-10-11 ENCOUNTER — Other Ambulatory Visit: Payer: Self-pay | Admitting: Internal Medicine

## 2022-01-03 ENCOUNTER — Other Ambulatory Visit: Payer: Self-pay | Admitting: Internal Medicine

## 2022-01-03 NOTE — Telephone Encounter (Signed)
Please refill as per office routine med refill policy (all routine meds to be refilled for 3 mo or monthly (per pt preference) up to one year from last visit, then month to month grace period for 3 mo, then further med refills will have to be denied) ? ?

## 2022-04-01 ENCOUNTER — Other Ambulatory Visit: Payer: Self-pay | Admitting: Internal Medicine

## 2022-04-02 NOTE — Telephone Encounter (Signed)
Please refill as per office routine med refill policy (all routine meds to be refilled for 3 mo or monthly (per pt preference) up to one year from last visit, then month to month grace period for 3 mo, then further med refills will have to be denied) ? ?

## 2022-05-11 ENCOUNTER — Ambulatory Visit (INDEPENDENT_AMBULATORY_CARE_PROVIDER_SITE_OTHER): Payer: Medicare Other

## 2022-05-11 DIAGNOSIS — Z0000 Encounter for general adult medical examination without abnormal findings: Secondary | ICD-10-CM

## 2022-05-11 NOTE — Progress Notes (Signed)
I connected with [name] today by telephone and verified that I am speaking with the correct person using two identifiers. Location patient: home Location provider: work Persons participating in the virtual visit: patient, provider.   I discussed the limitations, risks, security and privacy concerns of performing an evaluation and management service by telephone and the availability of in person appointments. I also discussed with the patient that there may be a patient responsible charge related to this service. The patient expressed understanding and verbally consented to this telephonic visit.    Interactive audio and video telecommunications were attempted between this provider and patient, however failed, due to patient having technical difficulties OR patient did not have access to video capability.  We continued and completed visit with audio only.  Some vital signs may be absent or patient reported.   Time Spent with patient on telephone encounter: 30 minutes  Subjective:   Austin [name] is a 81 y.o. male who presents for Medicare Annual/Subsequent preventive examination.  Review of Systems     Cardiac Risk Factors include: advanced age (>42mn, >>44women);family history of premature cardiovascular disease;hypertension;male gender     Objective:    There were no vitals filed for this visit. There is no height or weight on file to calculate BMI.     05/11/2022    9:22 AM 04/21/2021    9:10 AM 01/20/2020   11:11 AM 10/27/2019    1:32 PM 01/19/2016    9:01 AM 01/19/2016    9:00 AM  Advanced Directives  Does Patient Have a Medical Advance Directive? Yes No Yes Yes No Yes  Type of Advance Directive Living will;Healthcare Power of [place] will [place] will  [place] will  Does patient want to make changes to medical advance directive? No - Patient declined  No - Patient declined No -  Patient declined No - Patient declined No - Patient declined  Copy of [place] Chart? No - copy requested  No - copy requested No - copy requested  No - copy requested  Would patient like information on creating a medical advance directive?  No - Patient declined   No - patient declined information     Current Medications (verified) Outpatient Encounter Medications as of 05/11/2022  Medication Sig   ramipril (ALTACE) 5 MG capsule TAKE 1 CAPSULE BY MOUTH  DAILY   tamsulosin (FLOMAX) 0.4 MG CAPS capsule Take 0.4 mg by mouth daily.   triamcinolone cream (KENALOG) 0.1 % APPLY TO AFFECTED AREA(S)  TOPICALLY TWICE DAILY   aspirin 81 MG tablet Take 81 mg by mouth daily.   (Patient not taking: Reported on 04/03/2021)   azithromycin (ZITHROMAX) 250 MG tablet Day 1 take 2 pills, days 2-5 take 1 pill daily (Patient not taking: Reported on 04/03/2021)   Cholecalciferol 50 MCG (2000 UT) CAPS Take by mouth. (Patient not taking: Reported on 04/03/2021)   cyanocobalamin 1000 MCG tablet Take 1,000 mcg by mouth daily. (Patient not taking: Reported on 05/11/2022)   diclofenac Sodium (VOLTAREN) 1 % GEL diclofenac 1 % topical gel (Patient not taking: Reported on 04/03/2021)   diphenoxylate-atropine (LOMOTIL) 2.5-0.025 MG tablet Take 1 tablet by mouth 4 (four) times daily as needed for diarrhea or loose stools. (Patient not taking: Reported on 04/03/2021)   fluocinolone (SYNALAR) 0.01 % external solution SMARTSIG:Sparingly Topical Twice Daily PRN (Patient not taking: Reported on 04/03/2021)   ketoconazole (NIZORAL) 2 % cream  Apply 1 application topically daily. (Patient not taking: Reported on 05/11/2022)   nitazoxanide (ALINIA) 500 MG tablet Take 1 tablet (500 mg total) by mouth 2 (two) times daily with a meal. (Patient not taking: Reported on 04/03/2021)   promethazine-codeine (PHENERGAN WITH CODEINE) 6.25-10 MG/5ML syrup Take 5 mLs by mouth every 6 (six) hours as needed for cough. (Patient not taking:  Reported on 04/03/2021)   No facility-administered encounter medications on file as of 05/11/2022.    Allergies (verified) Iohexol and Ivp dye [iodinated contrast media]   History: Past Medical History:  Diagnosis Date   Allergy    Aortic atherosclerosis (HCC)    Cataract    forming left eye    Cholelithiasis    Hepatic steatosis    History of kidney stones    History of nephrolithiasis    Hypertension    Inguinal hernia    Internal hemorrhoids    Low back pain    Prostate cancer (HCC)    Renal cyst    Tubular adenoma of colon    Past Surgical History:  Procedure Laterality Date   COLONOSCOPY     HEMORRHOID SURGERY  age 40   POLYPECTOMY     PROSTATE BIOPSY     RADIOACTIVE SEED IMPLANT N/A 01/20/2020   Procedure: RADIOACTIVE SEED IMPLANT/BRACHYTHERAPY IMPLANT;  Surgeon: Ceasar Mons, MD;  Location: [facility];  Service: Urology;  Laterality: N/A;   SPACE OAR INSTILLATION N/A 01/20/2020   Procedure: SPACE OAR INSTILLATION;  Surgeon: Ceasar Mons, MD;  Location: [facility];  Service: Urology;  Laterality: N/A;   Family History  Problem Relation Age of Onset   Diabetes Mother    Liver cancer Father 43   COPD Other    Cancer Other        Renal Grandfather   Heart disease Other        CAD   Psoriasis Other    Colon polyps Maternal Uncle    Esophageal cancer Neg Hx    Pancreatic cancer Neg Hx    Prostate cancer Neg Hx    Rectal cancer Neg Hx    Stomach cancer Neg Hx    Colon cancer Neg Hx    Social History   Socioeconomic History   Marital status: Married    Spouse name: Not on file   Number of children: 2   Years of education: Not on file   Highest education level: Not on file  Occupational History   Occupation: [employer]  Tobacco Use   Smoking status: Former    Packs/day: 0.25    Years: 7.00    Total pack years: 1.75    Types: Cigarettes    Quit date: 10/15/1964    Years since quitting: 57.6   Smokeless  tobacco: Never   Tobacco comments:    quit 40+ yrs ago  Vaping Use   Vaping Use: Never used  Substance and Sexual Activity   Alcohol use: Yes    Alcohol/week: 0.0 standard drinks of alcohol    Comment: glass of wine seldom   Drug use: Never   Sexual activity: Not Currently  Other Topics Concern   Not on file  Social History Narrative   Not on file   Social Determinants of Health   Financial Resource Strain: Low Risk  (05/11/2022)   Overall Financial Resource Strain (CARDIA)    Difficulty of Paying Living Expenses: Not hard at all  Food Insecurity: No Food Insecurity (05/11/2022)   Hunger  Vital Sign    Worried About Charity fundraiser in the Last Year: Never true    Ran Out of Food in the Last Year: Never true  Transportation Needs: No Transportation Needs (05/11/2022)   PRAPARE - Hydrologist (Medical): No    Lack of Transportation (Non-Medical): No  Physical Activity: Sufficiently Active (05/11/2022)   Exercise Vital Sign    Days of Exercise per Week: 5 days    Minutes of Exercise per Session: 30 min  Stress: No Stress Concern Present (05/11/2022)   [place]    Feeling of Stress : Not at all  Social Connections: Moderately Isolated (05/11/2022)   Social Connection and Isolation Panel [NHANES]    Frequency of Communication with Friends and Family: More than three times a week    Frequency of Social Gatherings with Friends and Family: Twice a week    Attends Religious Services: Never    Marine scientist or Organizations: No    Attends Music therapist: Never    Marital Status: Married    Tobacco Counseling Counseling given: Not Answered Tobacco comments: quit 40+ yrs ago   Clinical Intake:  Pre-visit preparation completed: Yes  Pain : No/denies pain     Nutritional Risks: None Diabetes: No  How often do you need to have someone help you when you  read instructions, pamphlets, or other written materials from your doctor or pharmacy?: 1 - Never What is the last grade level you completed in school?: HSG; Associates's Degree  Diabetic? no  Interpreter Needed?: No  Information entered by :: Lisette Abu, LPN.   Activities of Daily Living    05/11/2022    9:34 AM  In your present state of health, do you have any difficulty performing the following activities:  Hearing? 0  Vision? 0  Difficulty concentrating or making decisions? 0  Walking or climbing stairs? 0  Dressing or bathing? 0  Doing errands, shopping? 0  Preparing Food and eating ? N  Using the Toilet? N  In the past six months, have you accidently leaked urine? N  Do you have problems with loss of bowel control? N  Managing your Medications? N  Managing your Finances? N  Housekeeping or managing your Housekeeping? N    Patient Care Team: Biagio Borg, MD as PCP - General Lovena Neighbours Conception Oms, MD as Consulting Physician (Urology) Tyler Pita, MD as Consulting Physician (Radiation Oncology) Delsa Sale, OD as Consulting Physician (Optometry)  Indicate any recent Medical Services you may have received from other than [facility] providers in the past year (date may be approximate).     Assessment:   This is a routine wellness examination for Daemian.  Hearing/Vision screen Hearing Screening - Comments:: Patient denied any hearing difficulty.   No hearing aids.  Vision Screening - Comments:: Patient does not wear any corrective lenses/contacts.   Eye exam done by: Teodoro Spray, OD.   Dietary issues and exercise activities discussed: Current Exercise Habits: Home exercise routine, Type of exercise: walking, Time (Minutes): 30, Frequency (Times/Week): 5, Weekly Exercise (Minutes/Week): 150, Intensity: Moderate, Exercise limited by: None identified   Goals Addressed             This Visit's Progress    To stay healthy and maintain.         Depression Screen    05/11/2022    9:27 AM 04/21/2021    9:14  AM 04/03/2021    9:06 AM 04/03/2021    8:37 AM 02/22/2020    3:17 PM 02/22/2020    2:34 PM 06/17/2019    9:01 AM  PHQ 2/9 Scores  PHQ - 2 Score 0 0 0 0 0 0 0    Fall Risk    05/11/2022    9:23 AM 04/21/2021    9:11 AM 04/03/2021    9:05 AM 04/03/2021    8:37 AM 02/22/2020    3:17 PM  Fall Risk   Falls in the past year? 0 0 0 0 0  Number falls in past yr: 0 0 0 0   Injury with Fall? 0 0 0 0   Risk for fall due to : No Fall Risks No Fall Risks  No Fall Risks   Follow up Falls evaluation completed Falls evaluation completed       FALL RISK PREVENTION PERTAINING TO THE HOME:  Any stairs in or around the home? Yes  If so, are there any without handrails? No  Home free of loose throw rugs in walkways, pet beds, electrical cords, etc? Yes  Adequate lighting in your home to reduce risk of falls? Yes   ASSISTIVE DEVICES UTILIZED TO PREVENT FALLS:  Life alert? No  Use of a cane, walker or w/c? No  Grab bars in the bathroom? Yes  Shower chair or bench in shower? No  Elevated toilet seat or a handicapped toilet? Yes   TIMED UP AND GO:  Was the test performed? No .  Length of time to ambulate 10 feet: n/a sec.   Appearance of gait: Gait not evaluated during this visit.  Cognitive Function:        05/11/2022    9:35 AM  6CIT Screen  What Year? 0 points  What month? 0 points  What time? 0 points  Count back from 20 0 points  Months in reverse 0 points  Repeat phrase 0 points  Total Score 0 points    Immunizations Immunization History  Administered Date(s) Administered   Fluad Quad(high Dose 65+) 06/17/2019   Influenza Split 08/13/2011, 07/21/2012   Influenza, High Dose Seasonal PF 08/14/2017, 08/07/2018, 08/16/2021   Influenza,inj,Quad PF,6+ Mos 07/22/2013, 08/31/2014   Influenza-Unspecified 09/29/2015, 08/22/2016, 08/14/2017, 08/07/2018, 09/13/2020   PFIZER(Purple Top)SARS-COV-2 Vaccination 03/06/2020,  03/28/2020, 09/28/2020   Pfizer Covid-19 Vaccine Bivalent Booster 72yr & up 08/16/2021   Pneumococcal Conjugate-13 07/22/2013   Pneumococcal Polysaccharide-23 06/17/2007, 07/21/2012   Td 11/23/1999, 06/29/2010   Tdap 02/22/2020   Zoster, Live 08/31/2014    TDAP status: Up to date  Flu Vaccine status: Up to date  Pneumococcal vaccine status: Up to date  Covid-19 vaccine status: Completed vaccines  Qualifies for Shingles Vaccine? Yes   Zostavax completed Yes   Shingrix Completed?: No.    Education has been provided regarding the importance of this vaccine. Patient has been advised to call insurance company to determine out of pocket expense if they have not yet received this vaccine. Advised may also receive vaccine at local pharmacy or Health Dept. Verbalized acceptance and understanding.  Screening Tests Health Maintenance  Topic Date Due   Zoster Vaccines- Shingrix (1 of 2) Never done   INFLUENZA VACCINE  05/15/2022   TETANUS/TDAP  02/21/2030   Pneumonia Vaccine 79 Years old  Completed   Hepatitis C Screening  Completed   HPV VACCINES  Aged Out   COLONOSCOPY (Pts 45-420yrInsurance coverage will need to be confirmed)  Discontinued   COVID-19 Vaccine  Discontinued    Health Maintenance  Health Maintenance Due  Topic Date Due   Zoster Vaccines- Shingrix (1 of 2) Never done    Colorectal cancer screening: No longer required.   Lung Cancer Screening: (Low Dose CT Chest recommended if Age 87-80 years, 30 pack-year currently smoking OR have quit w/in 15years.) does not qualify.   Lung Cancer Screening Referral: no  Additional Screening:  Hepatitis C Screening: does qualify; Completed 04/03/2021  Vision Screening: Recommended annual ophthalmology exams for early detection of glaucoma and other disorders of the eye. Is the patient up to date with their annual eye exam?  Yes  Who is the provider or what is the name of the office in which the patient attends annual eye  exams? Teodoro Spray, OD. If pt is not established with a provider, would they like to be referred to a provider to establish care? No .   Dental Screening: Recommended annual dental exams for proper oral hygiene  Community Resource Referral / Chronic Care Management: CRR required this visit?  No   CCM required this visit?  No      Plan:     I have personally reviewed and noted the following in the patient's chart:   Medical and social history Use of alcohol, tobacco or illicit drugs  Current medications and supplements including opioid prescriptions. Patient is not currently taking opioid prescriptions. Functional ability and status Nutritional status Physical activity Advanced directives List of other physicians Hospitalizations, surgeries, and ER visits in previous 12 months Vitals Screenings to include cognitive, depression, and falls Referrals and appointments  In addition, I have reviewed and discussed with patient certain preventive protocols, quality metrics, and best practice recommendations. A written personalized care plan for preventive services as well as general preventive health recommendations were provided to patient.     Sheral Flow, LPN   4/31/5400   Nurse Notes:  Patient is cogitatively intact. There were no vitals filed for this visit. There is no height or weight on file to calculate BMI. Patient stated that he has no issues with gait or balance; does not use any assistive devices.

## 2022-05-11 NOTE — Patient Instructions (Signed)
Austin [name] , Thank you for taking time to come for your Medicare Wellness Visit. I appreciate your ongoing commitment to your health goals. Please review the following plan we discussed and let me know if I can assist you in the future.   Screening recommendations/referrals: Colonoscopy: Discontinued Recommended yearly ophthalmology/optometry visit for glaucoma screening and checkup Recommended yearly dental visit for hygiene and checkup  Vaccinations: Influenza vaccine: 08/16/2021 Pneumococcal vaccine: 07/21/2012, 07/22/2013 Tdap vaccine: 02/22/2020; due every 10 years Shingles vaccine: never done   Covid-19: 03/06/2020, 03/28/2020, 09/28/2020, 08/16/2021  Advanced directives: Yes; Please bring a copy of your health care power of attorney and living will to the office at your convenience.  Conditions/risks identified: Yes  Next appointment: Please schedule your next Medicare Wellness Visit with your Nurse Health Advisor in 1 year by calling [phone].  Preventive Care 24 Years and Older, Male Preventive care refers to lifestyle choices and visits with your health care provider that can promote health and wellness. What does preventive care include? A yearly physical exam. This is also called an annual well check. Dental exams once or twice a year. Routine eye exams. Ask your health care provider how often you should have your eyes checked. Personal lifestyle choices, including: Daily care of your teeth and gums. Regular physical activity. Eating a healthy diet. Avoiding tobacco and drug use. Limiting alcohol use. Practicing safe sex. Taking low doses of aspirin every day. Taking vitamin and mineral supplements as recommended by your health care provider. What happens during an annual well check? The services and screenings done by your health care provider during your annual well check will depend on your age, overall health, lifestyle risk factors, and family history of  disease. Counseling  Your health care provider may ask you questions about your: Alcohol use. Tobacco use. Drug use. Emotional well-being. Home and relationship well-being. Sexual activity. Eating habits. History of falls. Memory and ability to understand (cognition). Work and work Statistician. Screening  You may have the following tests or measurements: Height, weight, and BMI. Blood pressure. Lipid and cholesterol levels. These may be checked every 5 years, or more frequently if you are over 36 years old. Skin check. Lung cancer screening. You may have this screening every year starting at age 77 if you have a 30-pack-year history of smoking and currently smoke or have quit within the past 15 years. Fecal occult blood test (FOBT) of the stool. You may have this test every year starting at age 37. Flexible sigmoidoscopy or colonoscopy. You may have a sigmoidoscopy every 5 years or a colonoscopy every 10 years starting at age 64. Prostate cancer screening. Recommendations will vary depending on your family history and other risks. Hepatitis C blood test. Hepatitis B blood test. Sexually transmitted disease (STD) testing. Diabetes screening. This is done by checking your blood sugar (glucose) after you have not eaten for a while (fasting). You may have this done every 1-3 years. Abdominal aortic aneurysm (AAA) screening. You may need this if you are a current or former smoker. Osteoporosis. You may be screened starting at age 41 if you are at high risk. Talk with your health care provider about your test results, treatment options, and if necessary, the need for more tests. Vaccines  Your health care provider may recommend certain vaccines, such as: Influenza vaccine. This is recommended every year. Tetanus, diphtheria, and acellular pertussis (Tdap, Td) vaccine. You may need a Td booster every 10 years. Zoster vaccine. You may need this after age  60. Pneumococcal 13-valent  conjugate (PCV13) vaccine. One dose is recommended after age 74. Pneumococcal polysaccharide (PPSV23) vaccine. One dose is recommended after age 41. Talk to your health care provider about which screenings and vaccines you need and how often you need them. This information is not intended to replace advice given to you by your health care provider. Make sure you discuss any questions you have with your health care provider. Document Released: 10/28/2015 Document Revised: 06/20/2016 Document Reviewed: 08/02/2015 Elsevier Interactive Patient Education  2017 [place] Prevention in the Home Falls can cause injuries. They can happen to people of all ages. There are many things you can do to make your home safe and to help prevent falls. What can I do on the outside of my home? Regularly fix the edges of walkways and driveways and fix any cracks. Remove anything that might make you trip as you walk through a door, such as a raised step or threshold. Trim any bushes or trees on the path to your home. Use bright outdoor lighting. Clear any walking paths of anything that might make someone trip, such as rocks or tools. Regularly check to see if handrails are loose or broken. Make sure that both sides of any steps have handrails. Any raised decks and porches should have guardrails on the edges. Have any leaves, snow, or ice cleared regularly. Use sand or salt on walking paths during winter. Clean up any spills in your garage right away. This includes oil or grease spills. What can I do in the bathroom? Use night lights. Install grab bars by the toilet and in the tub and shower. Do not use towel bars as grab bars. Use non-skid mats or decals in the tub or shower. If you need to sit down in the shower, use a plastic, non-slip stool. Keep the floor dry. Clean up any water that spills on the floor as soon as it happens. Remove soap buildup in the tub or shower regularly. Attach bath mats  securely with double-sided non-slip rug tape. Do not have throw rugs and other things on the floor that can make you trip. What can I do in the bedroom? Use night lights. Make sure that you have a light by your bed that is easy to reach. Do not use any sheets or blankets that are too big for your bed. They should not hang down onto the floor. Have a firm chair that has side arms. You can use this for support while you get dressed. Do not have throw rugs and other things on the floor that can make you trip. What can I do in the kitchen? Clean up any spills right away. Avoid walking on wet floors. Keep items that you use a lot in easy-to-reach places. If you need to reach something above you, use a strong step stool that has a grab bar. Keep electrical cords out of the way. Do not use floor polish or wax that makes floors slippery. If you must use wax, use non-skid floor wax. Do not have throw rugs and other things on the floor that can make you trip. What can I do with my stairs? Do not leave any items on the stairs. Make sure that there are handrails on both sides of the stairs and use them. Fix handrails that are broken or loose. Make sure that handrails are as long as the stairways. Check any carpeting to make sure that it is firmly attached to the stairs. Fix  any carpet that is loose or worn. Avoid having throw rugs at the top or bottom of the stairs. If you do have throw rugs, attach them to the floor with carpet tape. Make sure that you have a light switch at the top of the stairs and the bottom of the stairs. If you do not have them, ask someone to add them for you. What else can I do to help prevent falls? Wear shoes that: Do not have high heels. Have rubber bottoms. Are comfortable and fit you well. Are closed at the toe. Do not wear sandals. If you use a stepladder: Make sure that it is fully opened. Do not climb a closed stepladder. Make sure that both sides of the stepladder  are locked into place. Ask someone to hold it for you, if possible. Clearly mark and make sure that you can see: Any grab bars or handrails. First and last steps. Where the edge of each step is. Use tools that help you move around (mobility aids) if they are needed. These include: Canes. Walkers. Scooters. Crutches. Turn on the lights when you go into a dark area. Replace any light bulbs as soon as they burn out. Set up your furniture so you have a clear path. Avoid moving your furniture around. If any of your floors are uneven, fix them. If there are any pets around you, be aware of where they are. Review your medicines with your doctor. Some medicines can make you feel dizzy. This can increase your chance of falling. Ask your doctor what other things that you can do to help prevent falls. This information is not intended to replace advice given to you by your health care provider. Make sure you discuss any questions you have with your health care provider. Document Released: 07/28/2009 Document Revised: 03/08/2016 Document Reviewed: 11/05/2014 Elsevier Interactive Patient Education  2017 [employer].

## 2022-05-14 ENCOUNTER — Encounter: Payer: Self-pay | Admitting: Internal Medicine

## 2022-05-14 ENCOUNTER — Ambulatory Visit (INDEPENDENT_AMBULATORY_CARE_PROVIDER_SITE_OTHER): Payer: Medicare Other | Admitting: Internal Medicine

## 2022-05-14 VITALS — BP 136/62 | HR 55 | Temp 97.6°F | Ht 72.0 in | Wt 186.0 lb

## 2022-05-14 DIAGNOSIS — E559 Vitamin D deficiency, unspecified: Secondary | ICD-10-CM

## 2022-05-14 DIAGNOSIS — Z0001 Encounter for general adult medical examination with abnormal findings: Secondary | ICD-10-CM | POA: Diagnosis not present

## 2022-05-14 DIAGNOSIS — E538 Deficiency of other specified B group vitamins: Secondary | ICD-10-CM | POA: Diagnosis not present

## 2022-05-14 DIAGNOSIS — R739 Hyperglycemia, unspecified: Secondary | ICD-10-CM | POA: Diagnosis not present

## 2022-05-14 DIAGNOSIS — I10 Essential (primary) hypertension: Secondary | ICD-10-CM

## 2022-05-14 LAB — HEPATIC FUNCTION PANEL
ALT: 13 U/L (ref 0–53)
AST: 18 U/L (ref 0–37)
Albumin: 4.3 g/dL (ref 3.5–5.2)
Alkaline Phosphatase: 40 U/L (ref 39–117)
Bilirubin, Direct: 0.2 mg/dL (ref 0.0–0.3)
Total Bilirubin: 0.7 mg/dL (ref 0.2–1.2)
Total Protein: 6.9 g/dL (ref 6.0–8.3)

## 2022-05-14 LAB — URINALYSIS, ROUTINE W REFLEX MICROSCOPIC
Bilirubin Urine: NEGATIVE
Hgb urine dipstick: NEGATIVE
Ketones, ur: NEGATIVE
Leukocytes,Ua: NEGATIVE
Nitrite: NEGATIVE
RBC / HPF: NONE SEEN (ref 0–?)
Specific Gravity, Urine: 1.02 (ref 1.000–1.030)
Total Protein, Urine: NEGATIVE
Urine Glucose: NEGATIVE
Urobilinogen, UA: 0.2 (ref 0.0–1.0)
pH: 6 (ref 5.0–8.0)

## 2022-05-14 LAB — BASIC METABOLIC PANEL
BUN: 17 mg/dL (ref 6–23)
CO2: 27 mEq/L (ref 19–32)
Calcium: 9.3 mg/dL (ref 8.4–10.5)
Chloride: 105 mEq/L (ref 96–112)
Creatinine, Ser: 0.9 mg/dL (ref 0.40–1.50)
GFR: 81.68 mL/min (ref >60.00)
Glucose, Bld: 98 mg/dL (ref 70–99)
Potassium: 4.3 mEq/L (ref 3.5–5.1)
Sodium: 140 mEq/L (ref 135–145)

## 2022-05-14 LAB — CBC WITH DIFFERENTIAL/PLATELET
Basophils Absolute: 0 10*3/uL (ref 0.0–0.1)
Basophils Relative: 1.1 % (ref 0.0–3.0)
Eosinophils Absolute: 0.2 10*3/uL (ref 0.0–0.7)
Eosinophils Relative: 5.1 % — ABNORMAL HIGH (ref 0.0–5.0)
HCT: 39.9 % (ref 39.0–52.0)
Hemoglobin: 13.5 g/dL (ref 13.0–17.0)
Lymphocytes Relative: 19.2 % (ref 12.0–46.0)
Lymphs Abs: 0.7 10*3/uL (ref 0.7–4.0)
MCHC: 33.7 g/dL (ref 30.0–36.0)
MCV: 95.1 fl (ref 78.0–100.0)
Monocytes Absolute: 0.5 10*3/uL (ref 0.1–1.0)
Monocytes Relative: 12.1 % — ABNORMAL HIGH (ref 3.0–12.0)
Neutro Abs: 2.4 10*3/uL (ref 1.4–7.7)
Neutrophils Relative %: 62.5 % (ref 43.0–77.0)
Platelets: 139 10*3/uL — ABNORMAL LOW (ref 150.0–400.0)
RBC: 4.2 Mil/uL — ABNORMAL LOW (ref 4.22–5.81)
RDW: 13 % (ref 11.5–15.5)
WBC: 3.8 10*3/uL — ABNORMAL LOW (ref 4.0–10.5)

## 2022-05-14 LAB — LIPID PANEL
Cholesterol: 145 mg/dL (ref 0–200)
HDL: 73.9 mg/dL (ref >39.00)
LDL Cholesterol: 62 mg/dL (ref 0–99)
NonHDL: 70.69
Total CHOL/HDL Ratio: 2
Triglycerides: 44 mg/dL (ref 0.0–149.0)
VLDL: 8.8 mg/dL (ref 0.0–40.0)

## 2022-05-14 LAB — TSH: TSH: 3.19 u[IU]/mL (ref 0.35–5.50)

## 2022-05-14 LAB — HEMOGLOBIN A1C: Hgb A1c MFr Bld: 5.6 % (ref 4.6–6.5)

## 2022-05-14 LAB — VITAMIN D 25 HYDROXY (VIT D DEFICIENCY, FRACTURES): VITD: 31.22 ng/mL (ref 30.00–100.00)

## 2022-05-14 LAB — VITAMIN B12: Vitamin B-12: 633 pg/mL (ref 211–911)

## 2022-05-14 MED ORDER — TRIAMCINOLONE ACETONIDE 0.1 % EX CREA: TOPICAL_CREAM | CUTANEOUS | 2 refills | Status: DC

## 2022-05-14 MED ORDER — RAMIPRIL 5 MG PO CAPS: 5.0000 mg | ORAL_CAPSULE | Freq: Every day | ORAL | 3 refills | Status: DC

## 2022-05-14 MED ORDER — TAMSULOSIN HCL 0.4 MG PO CAPS: 0.4000 mg | ORAL_CAPSULE | Freq: Every day | ORAL | 3 refills | Status: DC

## 2022-05-14 NOTE — Assessment & Plan Note (Signed)
Lab Results  Component Value Date   HGBA1C 5.5 04/03/2021   Stable, pt to continue current medical treatment  - diet, wt control

## 2022-05-14 NOTE — Assessment & Plan Note (Signed)
Lab Results  Component Value Date   VITAMINB12 746 04/03/2021   Stable, cont oral replacement - b12 1000 mcg qd

## 2022-05-14 NOTE — Assessment & Plan Note (Signed)

## 2022-05-14 NOTE — Assessment & Plan Note (Signed)
Last vitamin D Lab Results  Component Value Date   VD25OH 39.97 04/03/2021   Low, to start oral replacement

## 2022-05-14 NOTE — Progress Notes (Addendum)
Patient ID: Austin [name], male   DOB: 1941/07/17, 81 y.o.   MRN: [id number]         Chief Complaint:: wellness exam and low vit d, hyperglycemia, htn, b12 deficiency       HPI:  Austin [name] is a 81 y.o. male here for wellness exam; up to date                        Also now taking Vit D. Taking B12.  Pt denies chest pain, increased sob or doe, wheezing, orthopnea, PND, increased LE swelling, palpitations, dizziness or syncope.   Pt denies polydipsia, polyuria, or new focal neuro s/s.   Pt denies fever, wt loss, night sweats, loss of appetite, or other constitutional symptoms   Wt Readings from Last 3 Encounters:  05/14/22 186 lb (84.4 kg)  04/03/21 181 lb (82.1 kg)  08/19/20 185 lb (83.9 kg)   BP Readings from Last 3 Encounters:  05/14/22 136/62  04/03/21 140/78  08/19/20 130/70   Immunization History  Administered Date(s) Administered   Fluad Quad(high Dose 65+) 06/17/2019   Influenza Split 08/13/2011, 07/21/2012   Influenza, High Dose Seasonal PF 08/14/2017, 08/07/2018, 08/16/2021   Influenza,inj,Quad PF,6+ Mos 07/22/2013, 08/31/2014   Influenza-Unspecified 09/29/2015, 08/22/2016, 08/14/2017, 08/07/2018, 09/13/2020   PFIZER(Purple Top)SARS-COV-2 Vaccination 03/06/2020, 03/28/2020, 09/28/2020   Pfizer Covid-19 Vaccine Bivalent Booster 33yrs & up 08/16/2021   Pneumococcal Conjugate-13 07/22/2013   Pneumococcal Polysaccharide-23 06/17/2007, 07/21/2012   Td 11/23/1999, 06/29/2010   Tdap 02/22/2020   Zoster, Live 08/31/2014  There are no preventive care reminders to display for this patient.    Past Medical History:  Diagnosis Date   Allergy    Aortic atherosclerosis (HCC)    Cataract    forming left eye    Cholelithiasis    Hepatic steatosis    History of kidney stones    History of nephrolithiasis    Hypertension    Inguinal hernia    Internal hemorrhoids    Low back pain    Prostate cancer (HCC)    Renal cyst    Tubular adenoma of colon    Past Surgical  History:  Procedure Laterality Date   COLONOSCOPY     HEMORRHOID SURGERY  age 46   POLYPECTOMY     PROSTATE BIOPSY     RADIOACTIVE SEED IMPLANT N/A 01/20/2020   Procedure: RADIOACTIVE SEED IMPLANT/BRACHYTHERAPY IMPLANT;  Surgeon: Rene Paci, MD;  Location: [facility];  Service: Urology;  Laterality: N/A;   SPACE OAR INSTILLATION N/A 01/20/2020   Procedure: SPACE OAR INSTILLATION;  Surgeon: Rene Paci, MD;  Location: [facility];  Service: Urology;  Laterality: N/A;    reports that he quit smoking about 57 years ago. His smoking use included cigarettes. He has a 1.75 pack-year smoking history. He has never used smokeless tobacco. He reports current alcohol use. He reports that he does not use drugs. family history includes COPD in an other family member; Cancer in an other family member; Colon polyps in his maternal uncle; Diabetes in his mother; Heart disease in an other family member; Liver cancer (age of onset: 54) in his father; Psoriasis in an other family member. Allergies  Allergen Reactions   Iohexol Hives   Ivp Dye [Iodinated Contrast Media] Hives   No current outpatient medications on file prior to visit.   No current facility-administered medications on file prior to visit.  ROS:  All others reviewed and negative.  Objective        PE:  BP 136/62 (BP Location: Right Arm, Patient Position: Sitting, Cuff Size: Large)   Pulse (!) 55   Temp 97.6 F (36.4 C) (Oral)   Ht 6' (1.829 m)   Wt 186 lb (84.4 kg)   SpO2 96%   BMI 25.23 kg/m                 Constitutional: Pt appears in NAD               HENT: Head: NCAT.                Right Ear: External ear normal.                 Left Ear: External ear normal.                Eyes: . Pupils are equal, round, and reactive to light. Conjunctivae and EOM are normal               Nose: without d/c or deformity               Neck: Neck supple. Gross normal ROM                Cardiovascular: Normal rate and regular rhythm.                 Pulmonary/Chest: Effort normal and breath sounds without rales or wheezing.                Abd:  Soft, NT, ND, + BS, no organomegaly               Neurological: Pt is alert. At baseline orientation, motor grossly intact               Skin: Skin is warm. No rashes, no other new lesions, LE edema - none               Psychiatric: Pt behavior is normal without agitation   Micro: none  Cardiac tracings I have personally interpreted today:  none  Pertinent Radiological findings (summarize): none   Lab Results  Component Value Date   WBC 3.9 (L) 04/03/2021   HGB 13.8 04/03/2021   HCT 40.6 04/03/2021   PLT 149.0 (L) 04/03/2021   GLUCOSE 102 (H) 04/03/2021   CHOL 144 04/03/2021   TRIG 37.0 04/03/2021   HDL 75.10 04/03/2021   LDLCALC 61 04/03/2021   ALT 14 04/03/2021   AST 19 04/03/2021   NA 140 04/03/2021   K 4.2 04/03/2021   CL 104 04/03/2021   CREATININE 0.82 04/03/2021   BUN 19 04/03/2021   CO2 27 04/03/2021   TSH 2.03 04/03/2021   PSA 5.07 (H) 06/17/2019   INR 1.0 04/03/2021   HGBA1C 5.5 04/03/2021   Assessment/Plan:  Austin [name] is a 81 y.o. White or Caucasian [1] male with  has a past medical history of Allergy, Aortic atherosclerosis (HCC), Cataract, Cholelithiasis, Hepatic steatosis, History of kidney stones, History of nephrolithiasis, Hypertension, Inguinal hernia, Internal hemorrhoids, Low back pain, Prostate cancer (HCC), Renal cyst, and Tubular adenoma of colon.  Vitamin D deficiency Last vitamin D Lab Results  Component Value Date   VD25OH 39.97 04/03/2021   Low, to start oral replacement   B12 deficiency Lab Results  Component Value Date   VITAMINB12 746 04/03/2021   Stable, cont oral replacement - b12 1000  mcg qd   Hyperglycemia Lab Results  Component Value Date   HGBA1C 5.5 04/03/2021   Stable, pt to continue current medical treatment  - diet, wt control   Essential  hypertension BP Readings from Last 3 Encounters:  05/14/22 136/62  04/03/21 140/78  08/19/20 130/70   Stable, pt to continue medical treatment altace 5 mg qd   Encounter for well adult exam with abnormal findings Age and sex appropriate education and counseling updated with regular exercise and diet Referrals for preventative services - none needed Immunizations addressed - none needed Smoking counseling  - none needed Evidence for depression or other mood disorder - none significant Most recent labs reviewed. I have personally reviewed and have noted: 1) the patient's medical and social history 2) The patient's current medications and supplements 3) The patient's height, weight, and BMI have been recorded in the chart  Followup: Return in about 1 year (around 05/15/2023).  Oliver Barre, MD 05/14/2022 9:55 AM [facility]

## 2022-05-14 NOTE — Patient Instructions (Signed)

## 2022-05-14 NOTE — Assessment & Plan Note (Signed)
BP Readings from Last 3 Encounters:  05/14/22 136/62  04/03/21 140/78  08/19/20 130/70   Stable, pt to continue medical treatment altace 5 mg qd

## 2022-07-23 ENCOUNTER — Encounter: Payer: Self-pay | Admitting: Internal Medicine

## 2023-03-13 ENCOUNTER — Ambulatory Visit (INDEPENDENT_AMBULATORY_CARE_PROVIDER_SITE_OTHER): Payer: Medicare Other | Admitting: Internal Medicine

## 2023-03-13 VITALS — BP 128/80 | HR 55 | Temp 98.1°F | Ht 72.0 in | Wt 185.6 lb

## 2023-03-13 DIAGNOSIS — J309 Allergic rhinitis, unspecified: Secondary | ICD-10-CM | POA: Diagnosis not present

## 2023-03-13 DIAGNOSIS — Z0001 Encounter for general adult medical examination with abnormal findings: Secondary | ICD-10-CM

## 2023-03-13 DIAGNOSIS — E611 Iron deficiency: Secondary | ICD-10-CM

## 2023-03-13 DIAGNOSIS — Z0000 Encounter for general adult medical examination without abnormal findings: Secondary | ICD-10-CM

## 2023-03-13 DIAGNOSIS — R739 Hyperglycemia, unspecified: Secondary | ICD-10-CM | POA: Diagnosis not present

## 2023-03-13 DIAGNOSIS — E538 Deficiency of other specified B group vitamins: Secondary | ICD-10-CM | POA: Diagnosis not present

## 2023-03-13 DIAGNOSIS — I10 Essential (primary) hypertension: Secondary | ICD-10-CM | POA: Diagnosis not present

## 2023-03-13 DIAGNOSIS — E559 Vitamin D deficiency, unspecified: Secondary | ICD-10-CM | POA: Diagnosis not present

## 2023-03-13 LAB — CBC WITH DIFFERENTIAL/PLATELET
Basophils Absolute: 0 10*3/uL (ref 0.0–0.1)
Basophils Relative: 0.9 % (ref 0.0–3.0)
Eosinophils Absolute: 0.2 10*3/uL (ref 0.0–0.7)
Eosinophils Relative: 4.2 % (ref 0.0–5.0)
HCT: 41.5 % (ref 39.0–52.0)
Hemoglobin: 13.8 g/dL (ref 13.0–17.0)
Lymphocytes Relative: 17.9 % (ref 12.0–46.0)
Lymphs Abs: 0.8 10*3/uL (ref 0.7–4.0)
MCHC: 33.2 g/dL (ref 30.0–36.0)
MCV: 95.8 fl (ref 78.0–100.0)
Monocytes Absolute: 0.5 10*3/uL (ref 0.1–1.0)
Monocytes Relative: 11.3 % (ref 3.0–12.0)
Neutro Abs: 2.9 10*3/uL (ref 1.4–7.7)
Neutrophils Relative %: 65.7 % (ref 43.0–77.0)
Platelets: 152 10*3/uL (ref 150.0–400.0)
RBC: 4.33 Mil/uL (ref 4.22–5.81)
RDW: 13.2 % (ref 11.5–15.5)
WBC: 4.5 10*3/uL (ref 4.0–10.5)

## 2023-03-13 LAB — LIPID PANEL
Cholesterol: 144 mg/dL (ref 0–200)
HDL: 78.2 mg/dL (ref >39.00)
LDL Cholesterol: 57 mg/dL (ref 0–99)
NonHDL: 65.44
Total CHOL/HDL Ratio: 2
Triglycerides: 41 mg/dL (ref 0.0–149.0)
VLDL: 8.2 mg/dL (ref 0.0–40.0)

## 2023-03-13 LAB — HEPATIC FUNCTION PANEL
ALT: 13 U/L (ref 0–53)
AST: 17 U/L (ref 0–37)
Albumin: 4.2 g/dL (ref 3.5–5.2)
Alkaline Phosphatase: 45 U/L (ref 39–117)
Bilirubin, Direct: 0.2 mg/dL (ref 0.0–0.3)
Total Bilirubin: 0.8 mg/dL (ref 0.2–1.2)
Total Protein: 7 g/dL (ref 6.0–8.3)

## 2023-03-13 LAB — FERRITIN: Ferritin: 70.3 ng/mL (ref 22.0–322.0)

## 2023-03-13 LAB — BASIC METABOLIC PANEL
BUN: 16 mg/dL (ref 6–23)
CO2: 30 mEq/L (ref 19–32)
Calcium: 9.5 mg/dL (ref 8.4–10.5)
Chloride: 104 mEq/L (ref 96–112)
Creatinine, Ser: 0.96 mg/dL (ref 0.40–1.50)
GFR: 75.15 mL/min (ref >60.00)
Glucose, Bld: 100 mg/dL — ABNORMAL HIGH (ref 70–99)
Potassium: 5.3 mEq/L — ABNORMAL HIGH (ref 3.5–5.1)
Sodium: 139 mEq/L (ref 135–145)

## 2023-03-13 LAB — URINALYSIS, ROUTINE W REFLEX MICROSCOPIC
Bilirubin Urine: NEGATIVE
Hgb urine dipstick: NEGATIVE
Ketones, ur: NEGATIVE
Leukocytes,Ua: NEGATIVE
Nitrite: NEGATIVE
RBC / HPF: NONE SEEN (ref 0–?)
Specific Gravity, Urine: 1.01 (ref 1.000–1.030)
Total Protein, Urine: NEGATIVE
Urine Glucose: NEGATIVE
Urobilinogen, UA: 0.2 (ref 0.0–1.0)
WBC, UA: NONE SEEN (ref 0–?)
pH: 6 (ref 5.0–8.0)

## 2023-03-13 LAB — IBC PANEL
Iron: 120 ug/dL (ref 42–165)
Saturation Ratios: 39.1 % (ref 20.0–50.0)
TIBC: 306.6 ug/dL (ref 250.0–450.0)
Transferrin: 219 mg/dL (ref 212.0–360.0)

## 2023-03-13 LAB — VITAMIN D 25 HYDROXY (VIT D DEFICIENCY, FRACTURES): VITD: 47.13 ng/mL (ref 30.00–100.00)

## 2023-03-13 LAB — TSH: TSH: 1.84 u[IU]/mL (ref 0.35–5.50)

## 2023-03-13 LAB — VITAMIN B12: Vitamin B-12: 786 pg/mL (ref 211–911)

## 2023-03-13 LAB — HEMOGLOBIN A1C: Hgb A1c MFr Bld: 5.3 % (ref 4.6–6.5)

## 2023-03-13 MED ORDER — TAMSULOSIN HCL 0.4 MG PO CAPS: 0.4000 mg | ORAL_CAPSULE | Freq: Every day | ORAL | 3 refills | Status: DC

## 2023-03-13 MED ORDER — RAMIPRIL 5 MG PO CAPS: 5.0000 mg | ORAL_CAPSULE | Freq: Every day | ORAL | 3 refills | Status: DC

## 2023-03-13 NOTE — Patient Instructions (Signed)

## 2023-03-13 NOTE — Progress Notes (Signed)
The test results show that your current treatment is OK, as the tests are stable, including the iron tests.  Please continue the same plan.  There is no other need for change of treatment or further evaluation based on these results, at this time.  thanks

## 2023-03-13 NOTE — Progress Notes (Unsigned)
Patient ID: Austin [name], male   DOB: 01-03-1941, 82 y.o.   MRN: [id number]         Chief Complaint:: wellness exam and htn, allergies, hyperglycemia, low b12, low vit d,        HPI:  Austin [name] is a 82 y.o. male here for wellness exam; up to date                     Also Pt denies chest pain, increased sob or doe, wheezing, orthopnea, PND, increased LE swelling, palpitations, dizziness or syncope.   Pt denies polydipsia, polyuria, or new focal neuro s/s.    Pt denies fever, wt loss, night sweats, loss of appetite, or other constitutional symptoms  Tx for uti feb 2024 also had recent PSA 0.20 per urology appt.  Does have several wks ongoing nasal allergy symptoms with clearish congestion, itch and sneezing, without fever, pain, ST, cough, swelling or wheezing.    Wt Readings from Last 3 Encounters:  03/13/23 185 lb 9.6 oz (84.2 kg)  05/14/22 186 lb (84.4 kg)  04/03/21 181 lb (82.1 kg)   BP Readings from Last 3 Encounters:  03/13/23 128/80  05/14/22 136/62  04/03/21 140/78   Immunization History  Administered Date(s) Administered   Fluad Quad(high Dose 65+) 06/17/2019   Influenza Split 08/13/2011, 07/21/2012   Influenza, High Dose Seasonal PF 08/14/2017, 08/07/2018, 08/16/2021   Influenza,inj,Quad PF,6+ Mos 07/22/2013, 08/31/2014   Influenza-Unspecified 09/29/2015, 08/22/2016, 08/14/2017, 08/07/2018, 09/13/2020, 07/23/2022   PFIZER(Purple Top)SARS-COV-2 Vaccination 03/06/2020, 03/28/2020, 09/28/2020   Pfizer Covid-19 Vaccine Bivalent Booster 82yrs & up 08/16/2021   Pneumococcal Conjugate-13 07/22/2013   Pneumococcal Polysaccharide-23 06/17/2007, 07/21/2012   Td 11/23/1999, 06/29/2010   Tdap 02/22/2020   Zoster Recombinat (Shingrix) 05/14/2022, 07/23/2022   Zoster, Live 08/31/2014   Health Maintenance Due  Topic Date Due   Medicare Annual Wellness (AWV)  05/12/2023      Past Medical History:  Diagnosis Date   Allergy    Aortic atherosclerosis (HCC)    Cataract     forming left eye    Cholelithiasis    Hepatic steatosis    History of kidney stones    History of nephrolithiasis    Hypertension    Inguinal hernia    Internal hemorrhoids    Low back pain    Prostate cancer (HCC)    Renal cyst    Tubular adenoma of colon    Past Surgical History:  Procedure Laterality Date   COLONOSCOPY     HEMORRHOID SURGERY  age 87   POLYPECTOMY     PROSTATE BIOPSY     RADIOACTIVE SEED IMPLANT N/A 01/20/2020   Procedure: RADIOACTIVE SEED IMPLANT/BRACHYTHERAPY IMPLANT;  Surgeon: Rene Paci, MD;  Location: [facility];  Service: Urology;  Laterality: N/A;   SPACE OAR INSTILLATION N/A 01/20/2020   Procedure: SPACE OAR INSTILLATION;  Surgeon: Rene Paci, MD;  Location: [facility];  Service: Urology;  Laterality: N/A;    reports that he quit smoking about 58 years ago. His smoking use included cigarettes. He has a 1.75 pack-year smoking history. He has never used smokeless tobacco. He reports current alcohol use. He reports that he does not use drugs. family history includes COPD in an other family member; Cancer in an other family member; Colon polyps in his maternal uncle; Diabetes in his mother; Heart disease in an other family member; Liver cancer (age of onset: 75) in his father; Psoriasis in an  other family member. Allergies  Allergen Reactions   Iohexol Hives   Ivp Dye [Iodinated Contrast Media] Hives   Current Outpatient Medications on File Prior to Visit  Medication Sig Dispense Refill   triamcinolone cream (KENALOG) 0.1 % APPLY TO AFFECTED AREA(S)  TOPICALLY TWICE DAILY 80 g 2   No current facility-administered medications on file prior to visit.        ROS:  All others reviewed and negative.  Objective        PE:  BP 128/80 (BP Location: Left Arm, Patient Position: Sitting, Cuff Size: Normal)   Pulse (!) 55   Temp 98.1 F (36.7 C) (Oral)   Ht 6' (1.829 m)   Wt 185 lb 9.6 oz (84.2 kg)    SpO2 97%   BMI 25.17 kg/m                 Constitutional: Pt appears in NAD               HENT: Head: NCAT.                Right Ear: External ear normal.                 Left Ear: External ear normal. Bilat tm's with mild erythema.  Max sinus areas non tender.  Pharynx with mild erythema, no exudate               Eyes: . Pupils are equal, round, and reactive to light. Conjunctivae and EOM are normal               Nose: without d/c or deformity               Neck: Neck supple. Gross normal ROM               Cardiovascular: Normal rate and regular rhythm.                 Pulmonary/Chest: Effort normal and breath sounds without rales or wheezing.                Abd:  Soft, NT, ND, + BS, no organomegaly               Neurological: Pt is alert. At baseline orientation, motor grossly intact               Skin: Skin is warm. No rashes, no other new lesions, LE edema - none               Psychiatric: Pt behavior is normal without agitation   Micro: none  Cardiac tracings I have personally interpreted today:  none  Pertinent Radiological findings (summarize): none   Lab Results  Component Value Date   WBC 4.5 03/13/2023   HGB 13.8 03/13/2023   HCT 41.5 03/13/2023   PLT 152.0 03/13/2023   GLUCOSE 100 (H) 03/13/2023   CHOL 144 03/13/2023   TRIG 41.0 03/13/2023   HDL 78.20 03/13/2023   LDLCALC 57 03/13/2023   ALT 13 03/13/2023   AST 17 03/13/2023   NA 139 03/13/2023   K 5.3 No hemolysis seen (H) 03/13/2023   CL 104 03/13/2023   CREATININE 0.96 03/13/2023   BUN 16 03/13/2023   CO2 30 03/13/2023   TSH 1.84 03/13/2023   PSA 5.07 (H) 06/17/2019   INR 1.0 04/03/2021   HGBA1C 5.3 03/13/2023   Assessment/Plan:  Austin [name] is a 82 y.o. White or Caucasian [1]  male with  has a past medical history of Allergy, Aortic atherosclerosis (HCC), Cataract, Cholelithiasis, Hepatic steatosis, History of kidney stones, History of nephrolithiasis, Hypertension, Inguinal hernia, Internal  hemorrhoids, Low back pain, Prostate cancer (HCC), Renal cyst, and Tubular adenoma of colon.  Encounter for well adult exam with abnormal findings Age and sex appropriate education and counseling updated with regular exercise and diet Referrals for preventative services - none needed Immunizations addressed - none needed Smoking counseling  - none needed Evidence for depression or other mood disorder - none significant Most recent labs reviewed. I have personally reviewed and have noted: 1) the patient's medical and social history 2) The patient's current medications and supplements 3) The patient's height, weight, and BMI have been recorded in the chart   Essential hypertension BP Readings from Last 3 Encounters:  03/13/23 128/80  05/14/22 136/62  04/03/21 140/78   Stable, pt to continue medical treatment altace 5 qd  Allergic rhinitis Mild to mod, for OTC allegra and nasacort asd,,  to f/u any worsening symptoms or concerns  Hyperglycemia Lab Results  Component Value Date   HGBA1C 5.3 03/13/2023   Stable, pt to continue current medical treatment  - diet, wt control  B12 deficiency Lab Results  Component Value Date   VITAMINB12 786 03/13/2023   Stable, cont oral replacement - b12 1000 mcg qd   Vitamin D deficiency Last vitamin D Lab Results  Component Value Date   VD25OH 47.13 03/13/2023   Stable, cont oral replacement  Followup: Return in about 1 year (around 03/12/2024).  Oliver Barre, MD 03/14/2023 9:18 PM  [facility] [place] [facility]

## 2023-03-14 ENCOUNTER — Encounter: Payer: Self-pay | Admitting: Internal Medicine

## 2023-03-14 NOTE — Assessment & Plan Note (Signed)

## 2023-03-14 NOTE — Assessment & Plan Note (Signed)
BP Readings from Last 3 Encounters:  03/13/23 128/80  05/14/22 136/62  04/03/21 140/78   Stable, pt to continue medical treatment altace 5 qd

## 2023-03-14 NOTE — Assessment & Plan Note (Signed)
Lab Results  Component Value Date   VITAMINB12 786 03/13/2023   Stable, cont oral replacement - b12 1000 mcg qd

## 2023-03-14 NOTE — Assessment & Plan Note (Signed)
Last vitamin D Lab Results  Component Value Date   VD25OH 47.13 03/13/2023   Stable, cont oral replacement

## 2023-03-14 NOTE — Assessment & Plan Note (Signed)
Mild to mod, for OTC allegra and nasacort asd,,  to f/u any worsening symptoms or concerns

## 2023-03-14 NOTE — Assessment & Plan Note (Signed)
Lab Results  Component Value Date   HGBA1C 5.3 03/13/2023   Stable, pt to continue current medical treatment  - diet, wt control

## 2023-05-14 ENCOUNTER — Ambulatory Visit: Payer: Medicare Other

## 2023-05-14 DIAGNOSIS — Z0000 Encounter for general adult medical examination without abnormal findings: Secondary | ICD-10-CM

## 2023-05-14 NOTE — Progress Notes (Signed)
Subjective:   Austin [name] is a 82 y.o. male who presents for Medicare Annual/Subsequent preventive examination.  Visit Complete: Virtual  I connected with  Austin [name] on 05/14/23 by a audio enabled telemedicine application and verified that I am speaking with the correct person using two identifiers.  Patient Location: Home  Provider Location: Home Office  I discussed the limitations of evaluation and management by telemedicine. The patient expressed understanding and agreed to proceed.  Patient Medicare AWV questionnaire was completed by the patient on 05/14/23; I have confirmed that all information answered by patient is correct and no changes since this date.  Review of Systems           Objective:    Today's Vitals   There is no height or weight on file to calculate BMI.     05/14/2023   10:15 AM 05/11/2022    9:22 AM 04/21/2021    9:10 AM 01/20/2020   11:11 AM 10/27/2019    1:32 PM 01/19/2016    9:01 AM 01/19/2016    9:00 AM  Advanced Directives  Does Patient Have a Medical Advance Directive? Yes Yes No Yes Yes No Yes  Type of Diplomatic Services operational officer Living will;Healthcare Power of [employer] Power of [place];Living will Healthcare Power of [place];Living will  Healthcare Power of [place];Living will  Does patient want to make changes to medical advance directive? No - Patient declined No - Patient declined  No - Patient declined No - Patient declined No - Patient declined No - Patient declined  Copy of Healthcare Power of Attorney in Chart? No - copy requested No - copy requested  No - copy requested No - copy requested  No - copy requested  Would patient like information on creating a medical advance directive?   No - Patient declined   No - patient declined information     Current Medications (verified) Outpatient Encounter Medications as of 05/14/2023  Medication Sig   ramipril (ALTACE) 5 MG capsule Take 1 capsule (5 mg total)  by mouth daily.   tamsulosin (FLOMAX) 0.4 MG CAPS capsule Take 1 capsule (0.4 mg total) by mouth daily.   triamcinolone cream (KENALOG) 0.1 % APPLY TO AFFECTED AREA(S)  TOPICALLY TWICE DAILY   No facility-administered encounter medications on file as of 05/14/2023.    Allergies (verified) Iohexol and Ivp dye [iodinated contrast media]   History: Past Medical History:  Diagnosis Date   Allergy    Aortic atherosclerosis (HCC)    Cataract    forming left eye    Cholelithiasis    Hepatic steatosis    History of kidney stones    History of nephrolithiasis    Hypertension    Inguinal hernia    Internal hemorrhoids    Low back pain    Prostate cancer (HCC)    Renal cyst    Tubular adenoma of colon    Past Surgical History:  Procedure Laterality Date   COLONOSCOPY     HEMORRHOID SURGERY  age 40   POLYPECTOMY     PROSTATE BIOPSY     RADIOACTIVE SEED IMPLANT N/A 01/20/2020   Procedure: RADIOACTIVE SEED IMPLANT/BRACHYTHERAPY IMPLANT;  Surgeon: Rene Paci, MD;  Location: [facility];  Service: Urology;  Laterality: N/A;   SPACE OAR INSTILLATION N/A 01/20/2020   Procedure: SPACE OAR INSTILLATION;  Surgeon: Rene Paci, MD;  Location: [facility];  Service: Urology;  Laterality: N/A;  Family History  Problem Relation Age of Onset   Diabetes Mother    Liver cancer Father 2   COPD Other    Cancer Other        Renal Grandfather   Heart disease Other        CAD   Psoriasis Other    Colon polyps Maternal Uncle    Esophageal cancer Neg Hx    Pancreatic cancer Neg Hx    Prostate cancer Neg Hx    Rectal cancer Neg Hx    Stomach cancer Neg Hx    Colon cancer Neg Hx    Social History   Socioeconomic History   Marital status: Married    Spouse name: Not on file   Number of children: 2   Years of education: Not on file   Highest education level: Not on file  Occupational History   Occupation: [employer]  Tobacco Use   Smoking  status: Former    Current packs/day: 0.00    Average packs/day: 0.3 packs/day for 7.0 years (1.8 ttl pk-yrs)    Types: Cigarettes    Start date: 10/15/1957    Quit date: 10/15/1964    Years since quitting: 58.6   Smokeless tobacco: Never   Tobacco comments:    quit 40+ yrs ago  Vaping Use   Vaping status: Never Used  Substance and Sexual Activity   Alcohol use: Yes    Alcohol/week: 3.0 standard drinks of alcohol    Types: 3 Glasses of wine per week    Comment: glass of wine seldom   Drug use: Never   Sexual activity: Not Currently  Other Topics Concern   Not on file  Social History Narrative   Not on file   Social Determinants of Health   Financial Resource Strain: Low Risk  (05/14/2023)   Overall Financial Resource Strain (CARDIA)    Difficulty of Paying Living Expenses: Not hard at all  Food Insecurity: No Food Insecurity (05/14/2023)   Hunger Vital Sign    Worried About Running Out of Food in the Last Year: Never true    Ran Out of Food in the Last Year: Never true  Transportation Needs: No Transportation Needs (05/14/2023)   PRAPARE - Administrator, Civil Service (Medical): No    Lack of Transportation (Non-Medical): No  Physical Activity: Sufficiently Active (05/14/2023)   Exercise Vital Sign    Days of Exercise per Week: 6 days    Minutes of Exercise per Session: 60 min  Stress: No Stress Concern Present (05/14/2023)   [employer] of Occupational Health - Occupational Stress Questionnaire    Feeling of Stress : Not at all  Social Connections: Moderately Isolated (05/14/2023)   Social Connection and Isolation Panel [NHANES]    Frequency of Communication with Friends and Family: More than three times a week    Frequency of Social Gatherings with Friends and Family: More than three times a week    Attends Religious Services: Never    Database administrator or Organizations: No    Attends Engineer, structural: Never    Marital Status: Married     Tobacco Counseling Counseling given: Not Answered Tobacco comments: quit 40+ yrs ago   Clinical Intake:  Pre-visit preparation completed: Yes  Pain : No/denies pain     Diabetes: No  How often do you need to have someone help you when you read instructions, pamphlets, or other written materials from your doctor or pharmacy?: 1 -  Never What is the last grade level you completed in school?: 12th grade and has associates degree  Interpreter Needed?: No  Information entered by :: Eliseo Squires, CMA   Activities of Daily Living    05/14/2023   10:05 AM  In your present state of health, do you have any difficulty performing the following activities:  Hearing? 0  Vision? 0  Difficulty concentrating or making decisions? 0  Walking or climbing stairs? 0  Dressing or bathing? 0  Doing errands, shopping? 0    Patient Care Team: Corwin Levins, MD as PCP - General Liliane Shi Dorian Furnace, MD as Consulting Physician (Urology) Margaretmary Dys, MD as Consulting Physician (Radiation Oncology) Carman Ching, OD as Consulting Physician (Optometry)  Indicate any recent Medical Services you may have received from other than [facility] providers in the past year (date may be approximate).     Assessment:   This is a routine wellness examination for Austin [name].  Hearing/Vision screen Vision Screening - Comments:: Last screening June 2024  Dietary issues and exercise activities discussed:     Goals Addressed   None   Depression Screen    05/14/2023   10:03 AM 05/14/2023    9:57 AM 03/13/2023    8:58 AM 05/14/2022    8:36 AM 05/14/2022    8:10 AM 05/11/2022    9:27 AM 04/21/2021    9:14 AM  PHQ 2/9 Scores  PHQ - 2 Score 0 0 0 0 0 0 0  PHQ- 9 Score 0    0      Fall Risk    05/14/2023   10:17 AM 03/13/2023    8:57 AM 05/14/2022    8:36 AM 05/14/2022    8:10 AM 05/11/2022    9:23 AM  Fall Risk   Falls in the past year? 0 0 0 0 0  Number falls in past yr: 0 0 0 0 0  Injury with  Fall? 0 0 0 0 0  Risk for fall due to : No Fall Risks No Fall Risks   No Fall Risks  Follow up  Falls evaluation completed   Falls evaluation completed    MEDICARE RISK AT HOME:  Medicare Risk at Home - 05/14/23 1017     Any stairs in or around the home? Yes    If so, are there any without handrails? Yes    Home free of loose throw rugs in walkways, pet beds, electrical cords, etc? Yes    Adequate lighting in your home to reduce risk of falls? Yes    Life alert? No    Use of a cane, walker or w/c? No    Grab bars in the bathroom? Yes    Shower chair or bench in shower? No    Elevated toilet seat or a handicapped toilet? No             TIMED UP AND GO:  Was the test performed? no  Cognitive Function:        05/14/2023   10:27 AM 05/11/2022    9:35 AM  6CIT Screen  What Year? 0 points 0 points  What month? 0 points 0 points  What time? 0 points 0 points  Count back from 20 0 points 0 points  Months in reverse 0 points 0 points  Repeat phrase 0 points 0 points  Total Score 0 points 0 points    Immunizations Immunization History  Administered Date(s) Administered   Fluad Quad(high Dose 65+) 06/17/2019  Influenza Split 08/13/2011, 07/21/2012   Influenza, High Dose Seasonal PF 08/14/2017, 08/07/2018, 08/16/2021   Influenza,inj,Quad PF,6+ Mos 07/22/2013, 08/31/2014   Influenza-Unspecified 09/29/2015, 08/22/2016, 08/14/2017, 08/07/2018, 09/13/2020, 07/23/2022   PFIZER(Purple Top)SARS-COV-2 Vaccination 03/06/2020, 03/28/2020, 09/28/2020   Pfizer Covid-19 Vaccine Bivalent Booster 14yrs & up 08/16/2021   Pneumococcal Conjugate-13 07/22/2013   Pneumococcal Polysaccharide-23 06/17/2007, 07/21/2012   Td 11/23/1999, 06/29/2010   Tdap 02/22/2020   Zoster Recombinant(Shingrix) 05/14/2022, 07/23/2022   Zoster, Live 08/31/2014    TDAP status: Up to date  Flu Vaccine status: Up to date  Pneumococcal vaccine status: Up to date  Covid-19 vaccine status: Completed  vaccines  Qualifies for Shingles Vaccine? Yes   Zostavax completed Yes   Shingrix Completed?: Yes  Screening Tests Health Maintenance  Topic Date Due   INFLUENZA VACCINE  05/16/2023   Medicare Annual Wellness (AWV)  05/13/2024   DTaP/Tdap/Td (4 - Td or Tdap) 02/21/2030   Pneumonia Vaccine 43+ Years old  Completed   Hepatitis C Screening  Completed   HPV VACCINES  Aged Out   Colonoscopy  Discontinued   COVID-19 Vaccine  Discontinued   Zoster Vaccines- Shingrix  Discontinued    Health Maintenance  There are no preventive care reminders to display for this patient.   Colorectal cancer screening: No longer required.   Lung Cancer Screening: (Low Dose CT Chest recommended if Age 67-80 years, 20 pack-year currently smoking OR have quit w/in 15years.) does not qualify.   Lung Cancer Screening Referral: N/A  Additional Screening:  Hepatitis C Screening: does qualify; Completed 04/03/2021  Vision Screening: Recommended annual ophthalmology exams for early detection of glaucoma and other disorders of the eye. Is the patient up to date with their annual eye exam?  Yes  Who is the provider or what is the name of the office in which the patient attends annual eye exams? N/A If pt is not established with a provider, would they like to be referred to a provider to establish care? No .   Dental Screening: Recommended annual dental exams for proper oral hygiene  Diabetic Foot Exam: Diabetic Foot Exam: Completed n/a  Community Resource Referral / Chronic Care Management: CRR required this visit?  No   CCM required this visit?  No     Plan:     I have personally reviewed and noted the following in the patient's chart:   Medical and social history Use of alcohol, tobacco or illicit drugs  Current medications and supplements including opioid prescriptions. Patient is not currently taking opioid prescriptions. Functional ability and status Nutritional status Physical  activity Advanced directives List of other physicians Hospitalizations, surgeries, and ER visits in previous 12 months Vitals Screenings to include cognitive, depression, and falls Referrals and appointments  In addition, I have reviewed and discussed with patient certain preventive protocols, quality metrics, and best practice recommendations. A written personalized care plan for preventive services as well as general preventive health recommendations were provided to patient.     Eliseo Squires, CMA   05/14/2023   After Visit Summary: (MyChart) Due to this being a telephonic visit, the after visit summary with patients personalized plan was offered to patient via MyChart   Nurse Notes: Vital Signs: Unable to obtain new vitals due to this being a telehealth visit.

## 2023-08-09 ENCOUNTER — Other Ambulatory Visit: Payer: Self-pay

## 2023-08-09 ENCOUNTER — Other Ambulatory Visit: Payer: Self-pay | Admitting: Internal Medicine

## 2023-11-12 ENCOUNTER — Telehealth: Payer: Self-pay

## 2023-11-12 NOTE — Telephone Encounter (Signed)
Copied from CRM [id number]. Topic: General - Other >> Nov 12, 2023  8:47 AM Austin [name] wrote: Reason for CRM: patient is requesting Dr to give him a call as soon as he get a break . [phone] patient didn't want to say what he needed . Patient just requesting Dr to give him a call

## 2023-11-14 NOTE — Telephone Encounter (Signed)
Called and left voice mail

## 2023-12-18 DIAGNOSIS — S83249A Other tear of medial meniscus, current injury, unspecified knee, initial encounter: Secondary | ICD-10-CM | POA: Insufficient documentation

## 2023-12-19 ENCOUNTER — Telehealth: Payer: Self-pay

## 2023-12-19 NOTE — Telephone Encounter (Signed)
 Copied from CRM [id number]. Topic: Medical Record Request - Other >> Dec 19, 2023 12:54 PM [place] J wrote: Reason for CRM: Patient would like a call back from Dr. Raphael Gibney nurse regarding a form for surgery clearance faxed through from Dr. Ranell Patrick. Patient needs paper work as soon as possible for.

## 2023-12-20 ENCOUNTER — Encounter: Payer: Self-pay | Admitting: Internal Medicine

## 2023-12-23 NOTE — Telephone Encounter (Signed)
 Sent message Via Mychart Pt must come in for an OV to be cleared for surgery.

## 2023-12-23 NOTE — Telephone Encounter (Signed)
 Called and got Pt scheduled.

## 2023-12-25 ENCOUNTER — Telehealth: Payer: Self-pay | Admitting: Internal Medicine

## 2023-12-25 ENCOUNTER — Other Ambulatory Visit: Payer: Self-pay | Admitting: Internal Medicine

## 2023-12-25 ENCOUNTER — Encounter: Payer: Self-pay | Admitting: Internal Medicine

## 2023-12-25 ENCOUNTER — Ambulatory Visit: Admitting: Internal Medicine

## 2023-12-25 VITALS — BP 122/78 | HR 60 | Temp 97.6°F | Ht 72.0 in | Wt 186.0 lb

## 2023-12-25 DIAGNOSIS — I10 Essential (primary) hypertension: Secondary | ICD-10-CM

## 2023-12-25 DIAGNOSIS — Z01818 Encounter for other preprocedural examination: Secondary | ICD-10-CM

## 2023-12-25 DIAGNOSIS — E875 Hyperkalemia: Secondary | ICD-10-CM

## 2023-12-25 DIAGNOSIS — E559 Vitamin D deficiency, unspecified: Secondary | ICD-10-CM | POA: Diagnosis not present

## 2023-12-25 DIAGNOSIS — E538 Deficiency of other specified B group vitamins: Secondary | ICD-10-CM | POA: Diagnosis not present

## 2023-12-25 DIAGNOSIS — R739 Hyperglycemia, unspecified: Secondary | ICD-10-CM | POA: Diagnosis not present

## 2023-12-25 LAB — CBC WITH DIFFERENTIAL/PLATELET
Basophils Absolute: 0 10*3/uL (ref 0.0–0.1)
Basophils Relative: 1 % (ref 0.0–3.0)
Eosinophils Absolute: 0.2 10*3/uL (ref 0.0–0.7)
Eosinophils Relative: 3.7 % (ref 0.0–5.0)
HCT: 40.4 % (ref 39.0–52.0)
Hemoglobin: 13.6 g/dL (ref 13.0–17.0)
Lymphocytes Relative: 17.6 % (ref 12.0–46.0)
Lymphs Abs: 0.8 10*3/uL (ref 0.7–4.0)
MCHC: 33.7 g/dL (ref 30.0–36.0)
MCV: 96.4 fl (ref 78.0–100.0)
Monocytes Absolute: 0.5 10*3/uL (ref 0.1–1.0)
Monocytes Relative: 11.3 % (ref 3.0–12.0)
Neutro Abs: 3 10*3/uL (ref 1.4–7.7)
Neutrophils Relative %: 66.4 % (ref 43.0–77.0)
Platelets: 163 10*3/uL (ref 150.0–400.0)
RBC: 4.19 Mil/uL — ABNORMAL LOW (ref 4.22–5.81)
RDW: 13.7 % (ref 11.5–15.5)
WBC: 4.5 10*3/uL (ref 4.0–10.5)

## 2023-12-25 LAB — URINALYSIS, ROUTINE W REFLEX MICROSCOPIC
Bilirubin Urine: NEGATIVE
Hgb urine dipstick: NEGATIVE
Ketones, ur: NEGATIVE
Leukocytes,Ua: NEGATIVE
Nitrite: NEGATIVE
RBC / HPF: NONE SEEN (ref 0–?)
Specific Gravity, Urine: 1.015 (ref 1.000–1.030)
Total Protein, Urine: NEGATIVE
Urine Glucose: NEGATIVE
Urobilinogen, UA: 0.2 (ref 0.0–1.0)
pH: 6 (ref 5.0–8.0)

## 2023-12-25 LAB — HEPATIC FUNCTION PANEL
ALT: 14 U/L (ref 0–53)
AST: 18 U/L (ref 0–37)
Albumin: 4.4 g/dL (ref 3.5–5.2)
Alkaline Phosphatase: 42 U/L (ref 39–117)
Bilirubin, Direct: 0.2 mg/dL (ref 0.0–0.3)
Total Bilirubin: 0.7 mg/dL (ref 0.2–1.2)
Total Protein: 7 g/dL (ref 6.0–8.3)

## 2023-12-25 LAB — BASIC METABOLIC PANEL
BUN: 20 mg/dL (ref 6–23)
CO2: 27 meq/L (ref 19–32)
Calcium: 9.8 mg/dL (ref 8.4–10.5)
Chloride: 104 meq/L (ref 96–112)
Creatinine, Ser: 0.89 mg/dL (ref 0.40–1.50)
GFR: 81.03 mL/min (ref >60.00)
Glucose, Bld: 111 mg/dL — ABNORMAL HIGH (ref 70–99)
Potassium: 5.6 meq/L — ABNORMAL HIGH (ref 3.5–5.1)
Sodium: 138 meq/L (ref 135–145)

## 2023-12-25 LAB — VITAMIN B12: Vitamin B-12: 573 pg/mL (ref 211–911)

## 2023-12-25 LAB — TSH: TSH: 2.26 u[IU]/mL (ref 0.35–5.50)

## 2023-12-25 LAB — LIPID PANEL
Cholesterol: 159 mg/dL (ref 0–200)
HDL: 88.1 mg/dL (ref >39.00)
LDL Cholesterol: 62 mg/dL (ref 0–99)
NonHDL: 70.71
Total CHOL/HDL Ratio: 2
Triglycerides: 45 mg/dL (ref 0.0–149.0)
VLDL: 9 mg/dL (ref 0.0–40.0)

## 2023-12-25 LAB — HEMOGLOBIN A1C: Hgb A1c MFr Bld: 5.6 % (ref 4.6–6.5)

## 2023-12-25 LAB — VITAMIN D 25 HYDROXY (VIT D DEFICIENCY, FRACTURES): VITD: 37.29 ng/mL (ref 30.00–100.00)

## 2023-12-25 MED ORDER — AMLODIPINE BESYLATE 5 MG PO TABS: 5.0000 mg | ORAL_TABLET | Freq: Every day | ORAL | 3 refills | Status: DC

## 2023-12-25 NOTE — Patient Instructions (Addendum)
 Please check to see if you have had the Prevnar 20 at the pharmacy  Please continue all other medications as before, and refills have been done if requested.  Please have the pharmacy call with any other refills you may need.  Please continue your efforts at being more active, low cholesterol diet, and weight control  Please keep your appointments with your specialists as you may have planned  Please go to the LAB at the blood drawing area for the tests to be done  You will be contacted by phone if any changes need to be made immediately.  Otherwise, you will receive a letter about your results with an explanation, but please check with MyChart first.  Your form will be faxed soon for operative clearance

## 2023-12-25 NOTE — Telephone Encounter (Signed)
 Copied from CRM [id number]. Topic: Clinical - Prescription Issue >> Dec 25, 2023  2:12 PM Jon Gills C wrote: Reason for CRM: Patient called in stating that he would like the amLODipine (NORVASC) 5 MG tablet sent to [facility] instead of the [facility] , would like for someone to fixed that so he can get tat as soon as possible

## 2023-12-25 NOTE — Progress Notes (Signed)
 Patient ID: Austin [name], male   DOB: [birth year], 83 y.o.   MRN: [id number]        Chief Complaint: follow up preoperative exam, hyperkalemia, htn, low b12, hyperglycemia, low vit d       HPI:  Austin [name] is a 83 y.o. male here with plan for left knee arthroscopy soon ; Pt denies chest pain, increased sob or doe, wheezing, orthopnea, PND, increased LE swelling, palpitations, dizziness or syncope.   Pt denies polydipsia, polyuria, or new focal neuro s/s.    Pt denies fever, wt loss, night sweats, loss of appetite, or other constitutional symptoms         Wt Readings from Last 3 Encounters:  12/25/23 186 lb (84.4 kg)  03/13/23 185 lb 9.6 oz (84.2 kg)  05/14/22 186 lb (84.4 kg)   BP Readings from Last 3 Encounters:  12/25/23 122/78  03/13/23 128/80  05/14/22 136/62         Past Medical History:  Diagnosis Date   Allergy    Aortic atherosclerosis (HCC)    Cataract    forming left eye    Cholelithiasis    Hepatic steatosis    History of kidney stones    History of nephrolithiasis    Hypertension    Inguinal hernia    Internal hemorrhoids    Low back pain    Prostate cancer (HCC)    Renal cyst    Tubular adenoma of colon    Past Surgical History:  Procedure Laterality Date   COLONOSCOPY     HEMORRHOID SURGERY  age 89   POLYPECTOMY     PROSTATE BIOPSY     RADIOACTIVE SEED IMPLANT N/A 01/20/2020   Procedure: RADIOACTIVE SEED IMPLANT/BRACHYTHERAPY IMPLANT;  Surgeon: Rene Paci, MD;  Location: [facility];  Service: Urology;  Laterality: N/A;   SPACE OAR INSTILLATION N/A 01/20/2020   Procedure: SPACE OAR INSTILLATION;  Surgeon: Rene Paci, MD;  Location: [facility];  Service: Urology;  Laterality: N/A;    reports that he quit smoking about 59 years ago. His smoking use included cigarettes. He started smoking about 66 years ago. He has a 1.8 pack-year smoking history. He has never used smokeless tobacco. He reports  current alcohol use of about 3.0 standard drinks of alcohol per week. He reports that he does not use drugs. family history includes COPD in an other family member; Cancer in an other family member; Colon polyps in his maternal uncle; Diabetes in his mother; Heart disease in an other family member; Liver cancer (age of onset: 48) in his father; Psoriasis in an other family member. No Active Allergies Current Outpatient Medications on File Prior to Visit  Medication Sig Dispense Refill   tamsulosin (FLOMAX) 0.4 MG CAPS capsule Take 1 capsule (0.4 mg total) by mouth daily. 90 capsule 3   triamcinolone cream (KENALOG) 0.1 % APPLY TO AFFECTED AREA(S)  TOPICALLY TWICE DAILY 80 g 2   No current facility-administered medications on file prior to visit.        ROS:  All others reviewed and negative.  Objective        PE:  BP 122/78 (BP Location: Left Arm, Patient Position: Sitting, Cuff Size: Normal)   Pulse 60   Temp 97.6 F (36.4 C) (Oral)   Ht 6' (1.829 m)   Wt 186 lb (84.4 kg)   SpO2 98%   BMI 25.23 kg/m  Constitutional: Pt appears in NAD               HENT: Head: NCAT.                Right Ear: External ear normal.                 Left Ear: External ear normal.                Eyes: . Pupils are equal, round, and reactive to light. Conjunctivae and EOM are normal               Nose: without d/c or deformity               Neck: Neck supple. Gross normal ROM               Cardiovascular: Normal rate and regular rhythm.                 Pulmonary/Chest: Effort normal and breath sounds without rales or wheezing.                Abd:  Soft, NT, ND, + BS, no organomegaly               Neurological: Pt is alert. At baseline orientation, motor grossly intact               Skin: Skin is warm. No rashes, no other new lesions, LE edema - none               Psychiatric: Pt behavior is normal without agitation   Micro: none  Cardiac tracings I have personally interpreted today:   none  Pertinent Radiological findings (summarize): none   Lab Results  Component Value Date   WBC 4.5 12/25/2023   HGB 13.6 12/25/2023   HCT 40.4 12/25/2023   PLT 163.0 12/25/2023   GLUCOSE 111 (H) 12/25/2023   CHOL 159 12/25/2023   TRIG 45.0 12/25/2023   HDL 88.10 12/25/2023   LDLCALC 62 12/25/2023   ALT 14 12/25/2023   AST 18 12/25/2023   NA 138 12/25/2023   K 5.6 No hemolysis seen (H) 12/25/2023   CL 104 12/25/2023   CREATININE 0.89 12/25/2023   BUN 20 12/25/2023   CO2 27 12/25/2023   TSH 2.26 12/25/2023   PSA 5.07 (H) 06/17/2019   INR 1.0 04/03/2021   HGBA1C 5.6 12/25/2023   Assessment/Plan:  Austin [name] is a 83 y.o. White or Caucasian [1] male with  has a past medical history of Allergy, Aortic atherosclerosis (HCC), Cataract, Cholelithiasis, Hepatic steatosis, History of kidney stones, History of nephrolithiasis, Hypertension, Inguinal hernia, Internal hemorrhoids, Low back pain, Prostate cancer (HCC), Renal cyst, and Tubular adenoma of colon.  B12 deficiency Lab Results  Component Value Date   VITAMINB12 573 12/25/2023   Stable, cont oral replacement - b12 1000 mcg qd   Essential hypertension BP Readings from Last 3 Encounters:  12/25/23 122/78  03/13/23 128/80  05/14/22 136/62   Will be uncontrolled with d/c ACE,  pt to start amlodipine 5 qd   Hyperglycemia Lab Results  Component Value Date   HGBA1C 5.6 12/25/2023   Stable, pt to continue current medical treatment - diet,wt control   Vitamin D deficiency Last vitamin D Lab Results  Component Value Date   VD25OH 37.29 12/25/2023   Low, to start oral replacement   Hyperkalemia Lab Results  Component Value Date   K 5.6 No hemolysis seen (H)  12/25/2023   Mild elevated, for d/c ACEI  Preop exam for internal medicine Ok for left knee arthroscopy  Followup: Return in about 3 months (around 03/26/2024), or if symptoms worsen or fail to improve.  Oliver Barre, MD 12/28/2023 9:26 PM [facility]

## 2023-12-26 NOTE — Telephone Encounter (Signed)
 It was sent to [facility] yesterday.

## 2023-12-28 ENCOUNTER — Encounter: Payer: Self-pay | Admitting: Internal Medicine

## 2023-12-28 DIAGNOSIS — Z01818 Encounter for other preprocedural examination: Secondary | ICD-10-CM | POA: Insufficient documentation

## 2023-12-28 DIAGNOSIS — E875 Hyperkalemia: Secondary | ICD-10-CM | POA: Insufficient documentation

## 2023-12-28 NOTE — Assessment & Plan Note (Signed)
 Ok for left knee arthroscopy

## 2023-12-28 NOTE — Assessment & Plan Note (Signed)
 Lab Results  Component Value Date   VITAMINB12 573 12/25/2023   Stable, cont oral replacement - b12 1000 mcg qd

## 2023-12-28 NOTE — Assessment & Plan Note (Signed)
 BP Readings from Last 3 Encounters:  12/25/23 122/78  03/13/23 128/80  05/14/22 136/62   Will be uncontrolled with d/c ACE,  pt to start amlodipine 5 qd

## 2023-12-28 NOTE — Assessment & Plan Note (Signed)
 Lab Results  Component Value Date   HGBA1C 5.6 12/25/2023   Stable, pt to continue current medical treatment - diet,wt control

## 2023-12-28 NOTE — Assessment & Plan Note (Signed)
 Lab Results  Component Value Date   K 5.6 No hemolysis seen (H) 12/25/2023   Mild elevated, for d/c ACEI

## 2023-12-28 NOTE — Assessment & Plan Note (Signed)
 Last vitamin D Lab Results  Component Value Date   VD25OH 37.29 12/25/2023   Low, to start oral replacement

## 2024-03-03 ENCOUNTER — Ambulatory Visit (INDEPENDENT_AMBULATORY_CARE_PROVIDER_SITE_OTHER)

## 2024-03-03 VITALS — BP 122/72 | Ht 72.0 in | Wt 181.0 lb

## 2024-03-03 DIAGNOSIS — Z0000 Encounter for general adult medical examination without abnormal findings: Secondary | ICD-10-CM | POA: Diagnosis not present

## 2024-03-03 NOTE — Patient Instructions (Signed)
 Austin [name] , Thank you for taking time out of your busy schedule to complete your Annual Wellness Visit with me. I enjoyed our conversation and look forward to speaking with you again next year. I, as well as your care team,  appreciate your ongoing commitment to your health goals. Please review the following plan we discussed and let me know if I can assist you in the future. Your Game plan/ To Do List   Follow up Visits: Next Medicare AWV with our clinical staff: 03/19/2025   Have you seen your provider in the last 6 months (3 months if uncontrolled diabetes)? Yes Next Office Visit with your provider: 03/17/2024   Clinician Recommendations:  Aim for 30 minutes of exercise or brisk walking, 6-8 glasses of water , and 5 servings of fruits and vegetables each day.       This is a list of the screening recommended for you and due dates:  Health Maintenance  Topic Date Due   Flu Shot  05/15/2024   Medicare Annual Wellness Visit  03/03/2025   DTaP/Tdap/Td vaccine (4 - Td or Tdap) 02/21/2030   Pneumonia Vaccine  Completed   HPV Vaccine  Aged Out   Meningitis B Vaccine  Aged Out   Colon Cancer Screening  Discontinued   COVID-19 Vaccine  Discontinued   Hepatitis C Screening  Discontinued   Zoster (Shingles) Vaccine  Discontinued    Advanced directives: (Declined) Advance directive discussed with you today. Even though you declined this today, please call our office should you change your mind, and we can give you the proper paperwork for you to fill out. Advance Care Planning is important because it:  [x]  Makes sure you receive the medical care that is consistent with your values, goals, and preferences  [x]  It provides guidance to your family and loved ones and reduces their decisional burden about whether or not they are making the right decisions based on your wishes.  Follow the link provided in your after visit summary or read over the paperwork we have mailed to you to help you started  getting your Advance Directives in place. If you need assistance in completing these, please reach out to us  so that we can help you!

## 2024-03-03 NOTE — Progress Notes (Signed)
 Subjective:   Austin [name] is a 83 y.o. who presents for a Medicare Wellness preventive visit.  As a reminder, Annual Wellness Visits don't include a physical exam, and some assessments may be limited, especially if this visit is performed virtually. We may recommend an in-person follow-up visit with your provider if needed.  Visit Complete: In person  Persons Participating in Visit: Patient.  AWV Questionnaire: Yes: Patient Medicare AWV questionnaire was completed by the patient on 03/02/2024; I have confirmed that all information answered by patient is correct and no changes since this date.  Cardiac Risk Factors include: advanced age (>1men, >40 women);male gender;hypertension     Objective:     Today's Vitals   03/03/24 1301  BP: 122/72  Weight: 181 lb (82.1 kg)  Height: 6' (1.829 m)   Body mass index is 24.55 kg/m.     03/03/2024    1:00 PM 05/14/2023   10:15 AM 05/11/2022    9:22 AM 04/21/2021    9:10 AM 01/20/2020   11:11 AM 10/27/2019    1:32 PM 01/19/2016    9:01 AM  Advanced Directives  Does Patient Have a Medical Advance Directive? No Yes Yes No Yes Yes No  Type of Furniture conservator/restorer Living will;Healthcare Power of [employer] Power of [place];Living will Healthcare Power of [place];Living will   Does patient want to make changes to medical advance directive?  No - Patient declined No - Patient declined  No - Patient declined No - Patient declined No - Patient declined  Copy of Healthcare Power of Attorney in Chart?  No - copy requested No - copy requested  No - copy requested No - copy requested   Would patient like information on creating a medical advance directive? No - Patient declined   No - Patient declined   No - patient declined information    Current Medications (verified) Outpatient Encounter Medications as of 03/03/2024  Medication Sig   amLODipine  (NORVASC ) 5 MG tablet Take 1 tablet (5 mg total) by mouth daily.    tamsulosin  (FLOMAX ) 0.4 MG CAPS capsule Take 1 capsule (0.4 mg total) by mouth daily.   triamcinolone  cream (KENALOG ) 0.1 % APPLY TO AFFECTED AREA(S)  TOPICALLY TWICE DAILY   No facility-administered encounter medications on file as of 03/03/2024.    Allergies (verified) Patient has no active allergies.   History: Past Medical History:  Diagnosis Date   Allergy    Aortic atherosclerosis (HCC)    Cataract    forming left eye    Cholelithiasis    Hepatic steatosis    History of kidney stones    History of nephrolithiasis    Hypertension    Inguinal hernia    Internal hemorrhoids    Low back pain    Prostate cancer (HCC)    Renal cyst    Tubular adenoma of colon    Past Surgical History:  Procedure Laterality Date   COLONOSCOPY     HEMORRHOID SURGERY  age 76   POLYPECTOMY     PROSTATE BIOPSY     RADIOACTIVE SEED IMPLANT N/A 01/20/2020   Procedure: RADIOACTIVE SEED IMPLANT/BRACHYTHERAPY IMPLANT;  Surgeon: Adelbert Homans, MD;  Location: [facility];  Service: Urology;  Laterality: N/A;   SPACE OAR INSTILLATION N/A 01/20/2020   Procedure: SPACE OAR INSTILLATION;  Surgeon: Adelbert Homans, MD;  Location: [facility];  Service: Urology;  Laterality: N/A;   Family History  Problem  Relation Age of Onset   Diabetes Mother    Liver cancer Father 86   COPD Other    Cancer Other        Renal Grandfather   Heart disease Other        CAD   Psoriasis Other    Colon polyps Maternal Uncle    Esophageal cancer Neg Hx    Pancreatic cancer Neg Hx    Prostate cancer Neg Hx    Rectal cancer Neg Hx    Stomach cancer Neg Hx    Colon cancer Neg Hx    Social History   Socioeconomic History   Marital status: Married    Spouse name: Not on file   Number of children: 2   Years of education: Not on file   Highest education level: Associate degree: academic program  Occupational History   Occupation: [employer]  Tobacco Use   Smoking  status: Former    Current packs/day: 0.00    Average packs/day: 0.3 packs/day for 7.0 years (1.8 ttl pk-yrs)    Types: Cigarettes    Start date: 10/15/1957    Quit date: 10/15/1964    Years since quitting: 59.4    Passive exposure: Past   Smokeless tobacco: Never   Tobacco comments:    quit 40+ yrs ago  Vaping Use   Vaping status: Never Used  Substance and Sexual Activity   Alcohol use: Yes    Alcohol/week: 3.0 standard drinks of alcohol    Types: 3 Glasses of wine per week    Comment: glass of wine seldom   Drug use: Never   Sexual activity: Not Currently  Other Topics Concern   Not on file  Social History Narrative   Married   Social Drivers of Health   Financial Resource Strain: Low Risk  (03/03/2024)   Overall Financial Resource Strain (CARDIA)    Difficulty of Paying Living Expenses: Not hard at all  Food Insecurity: No Food Insecurity (03/03/2024)   Hunger Vital Sign    Worried About Running Out of Food in the Last Year: Never true    Ran Out of Food in the Last Year: Never true  Transportation Needs: No Transportation Needs (03/03/2024)   PRAPARE - Administrator, Civil Service (Medical): No    Lack of Transportation (Non-Medical): No  Physical Activity: Insufficiently Active (03/03/2024)   Exercise Vital Sign    Days of Exercise per Week: 3 days    Minutes of Exercise per Session: 40 min  Stress: No Stress Concern Present (03/03/2024)   [employer] of Occupational Health - Occupational Stress Questionnaire    Feeling of Stress : Not at all  Social Connections: Moderately Integrated (03/03/2024)   Social Connection and Isolation Panel [NHANES]    Frequency of Communication with Friends and Family: Three times a week    Frequency of Social Gatherings with Friends and Family: Twice a week    Attends Religious Services: 1 to 4 times per year    Active Member of [employer] or Organizations: No    Attends Engineer, structural: Never    Marital Status:  Married    Tobacco Counseling Counseling given: No Tobacco comments: quit 40+ yrs ago    Clinical Intake:  Pre-visit preparation completed: Yes  Pain : No/denies pain     BMI - recorded: 24.55 Nutritional Risks: None Diabetes: No  Lab Results  Component Value Date   HGBA1C 5.6 12/25/2023   HGBA1C 5.3 03/13/2023  HGBA1C 5.6 05/14/2022     How often do you need to have someone help you when you read instructions, pamphlets, or other written materials from your doctor or pharmacy?: 1 - Never  Interpreter Needed?: No  Information entered by :: Kandy Orris, CMA   Activities of Daily Living     03/03/2024    1:03 PM 03/02/2024   10:35 AM  In your present state of health, do you have any difficulty performing the following activities:  Hearing? 0 0  Vision? 0 0  Difficulty concentrating or making decisions? 0 0  Walking or climbing stairs? 0 0  Dressing or bathing? 0 0  Doing errands, shopping? 0 0  Preparing Food and eating ? N N  Using the Toilet? N N  In the past six months, have you accidently leaked urine? N N  Do you have problems with loss of bowel control? N N  Managing your Medications? N N  Managing your Finances? N N  Housekeeping or managing your Housekeeping? N N    Patient Care Team: Roslyn Coombe, MD as PCP - General Sherrine Dolly Rhodia Cera, MD as Consulting Physician (Urology) Kenith Payer, MD as Consulting Physician (Radiation Oncology) Phebe Brasil, OD as Consulting Physician (Optometry)  Indicate any recent Medical Services you may have received from other than [facility] providers in the past year (date may be approximate).     Assessment:    This is a routine wellness examination for Mattheo.  Hearing/Vision screen Hearing Screening - Comments:: Denies hearing difficulties   Vision Screening - Comments:: No Vision Concerns - sees Dr Frosty Jews   Goals Addressed               This Visit's Progress     Patient Stated  (pt-stated)        Patient stated he stays active like cutting his own grass, etc.       Depression Screen     03/03/2024    1:04 PM 12/25/2023    8:44 AM 05/14/2023   10:03 AM 05/14/2023    9:57 AM 03/13/2023    8:58 AM 05/14/2022    8:36 AM 05/14/2022    8:10 AM  PHQ 2/9 Scores  PHQ - 2 Score 0 0 0 0 0 0 0  PHQ- 9 Score 0  0    0    Fall Risk     03/03/2024    1:07 PM 03/02/2024   10:35 AM 12/25/2023    8:50 AM 05/14/2023   10:17 AM 03/13/2023    8:57 AM  Fall Risk   Falls in the past year? 0 0 0 0 0  Number falls in past yr: 0 0 0 0 0  Injury with Fall? 0 0 0 0 0  Risk for fall due to : No Fall Risks  No Fall Risks No Fall Risks No Fall Risks  Follow up Falls evaluation completed;Falls prevention discussed  Falls evaluation completed  Falls evaluation completed    MEDICARE RISK AT HOME:  Medicare Risk at Home Any stairs in or around the home?: Yes If so, are there any without handrails?: No Home free of loose throw rugs in walkways, pet beds, electrical cords, etc?: Yes Adequate lighting in your home to reduce risk of falls?: Yes Life alert?: No Use of a cane, walker or w/c?: No Grab bars in the bathroom?: Yes Shower chair or bench in shower?: No Elevated toilet seat or a handicapped toilet?: No  TIMED  UP AND GO:  Was the test performed?  No  Cognitive Function: 6CIT completed        03/03/2024    1:07 PM 05/14/2023   10:27 AM 05/11/2022    9:35 AM  6CIT Screen  What Year? 0 points 0 points 0 points  What month? 0 points 0 points 0 points  What time? 0 points 0 points 0 points  Count back from 20 0 points 0 points 0 points  Months in reverse 0 points 0 points 0 points  Repeat phrase 0 points 0 points 0 points  Total Score 0 points 0 points 0 points    Immunizations Immunization History  Administered Date(s) Administered   Fluad Quad(high Dose 65+) 06/17/2019, 10/30/2023   Influenza Split 08/13/2011, 07/21/2012   Influenza, High Dose Seasonal PF  08/14/2017, 08/07/2018, 08/16/2021   Influenza,inj,Quad PF,6+ Mos 07/22/2013, 08/31/2014   Influenza-Unspecified 09/29/2015, 08/22/2016, 08/14/2017, 08/07/2018, 09/13/2020, 07/23/2022   PFIZER(Purple Top)SARS-COV-2 Vaccination 03/06/2020, 03/28/2020, 09/28/2020   Pfizer Covid-19 Vaccine Bivalent Booster 64yrs & up 08/16/2021   Pneumococcal Conjugate-13 07/22/2013   Pneumococcal Polysaccharide-23 06/17/2007, 07/21/2012   Td 11/23/1999, 06/29/2010   Tdap 02/22/2020   Zoster Recombinant(Shingrix) 05/14/2022, 07/23/2022   Zoster, Live 08/31/2014    Screening Tests Health Maintenance  Topic Date Due   INFLUENZA VACCINE  05/15/2024   Medicare Annual Wellness (AWV)  03/03/2025   DTaP/Tdap/Td (4 - Td or Tdap) 02/21/2030   Pneumonia Vaccine 19+ Years old  Completed   HPV VACCINES  Aged Out   Meningococcal B Vaccine  Aged Out   Colonoscopy  Discontinued   COVID-19 Vaccine  Discontinued   Hepatitis C Screening  Discontinued   Zoster Vaccines- Shingrix  Discontinued    Health Maintenance  There are no preventive care reminders to display for this patient. Health Maintenance Items Addressed: 03/03/2024   Additional Screening:  Vision Screening: Recommended annual ophthalmology exams for early detection of glaucoma and other disorders of the eye.  Dental Screening: Recommended annual dental exams for proper oral hygiene  Community Resource Referral / Chronic Care Management: CRR required this visit?  No   CCM required this visit?  No   Plan:    I have personally reviewed and noted the following in the patient's chart:   Medical and social history Use of alcohol, tobacco or illicit drugs  Current medications and supplements including opioid prescriptions. Patient is not currently taking opioid prescriptions. Functional ability and status Nutritional status Physical activity Advanced directives List of other physicians Hospitalizations, surgeries, and ER visits in previous  12 months Vitals Screenings to include cognitive, depression, and falls Referrals and appointments  In addition, I have reviewed and discussed with patient certain preventive protocols, quality metrics, and best practice recommendations. A written personalized care plan for preventive services as well as general preventive health recommendations were provided to patient.   Patria Bookbinder, CMA   03/03/2024   After Visit Summary: (In Person-Declined) Patient declined AVS at this time.  Notes: Nothing significant to report at this time.

## 2024-03-17 ENCOUNTER — Ambulatory Visit: Payer: Self-pay | Admitting: Internal Medicine

## 2024-03-17 ENCOUNTER — Ambulatory Visit (INDEPENDENT_AMBULATORY_CARE_PROVIDER_SITE_OTHER): Admitting: Internal Medicine

## 2024-03-17 ENCOUNTER — Encounter: Payer: Self-pay | Admitting: Internal Medicine

## 2024-03-17 VITALS — BP 122/72 | HR 62 | Temp 97.8°F | Ht 72.0 in | Wt 187.0 lb

## 2024-03-17 DIAGNOSIS — Z0000 Encounter for general adult medical examination without abnormal findings: Secondary | ICD-10-CM | POA: Diagnosis not present

## 2024-03-17 DIAGNOSIS — I10 Essential (primary) hypertension: Secondary | ICD-10-CM | POA: Diagnosis not present

## 2024-03-17 DIAGNOSIS — R739 Hyperglycemia, unspecified: Secondary | ICD-10-CM

## 2024-03-17 DIAGNOSIS — Z0001 Encounter for general adult medical examination with abnormal findings: Secondary | ICD-10-CM

## 2024-03-17 DIAGNOSIS — R7303 Prediabetes: Secondary | ICD-10-CM

## 2024-03-17 DIAGNOSIS — E875 Hyperkalemia: Secondary | ICD-10-CM

## 2024-03-17 LAB — BASIC METABOLIC PANEL WITH GFR
BUN: 15 mg/dL (ref 6–23)
CO2: 29 meq/L (ref 19–32)
Calcium: 9.4 mg/dL (ref 8.4–10.5)
Chloride: 105 meq/L (ref 96–112)
Creatinine, Ser: 0.87 mg/dL (ref 0.40–1.50)
GFR: 81.46 mL/min (ref >60.00)
Glucose, Bld: 99 mg/dL (ref 70–99)
Potassium: 4.1 meq/L (ref 3.5–5.1)
Sodium: 140 meq/L (ref 135–145)

## 2024-03-17 LAB — HEMOGLOBIN A1C: Hgb A1c MFr Bld: 5.4 % (ref 4.6–6.5)

## 2024-03-17 NOTE — Assessment & Plan Note (Signed)
 Lab Results  Component Value Date   K 5.6 No hemolysis seen (H) 12/25/2023   Mild uncontrolled now off altace  5 mg - for recheck today

## 2024-03-17 NOTE — Patient Instructions (Addendum)

## 2024-03-17 NOTE — Progress Notes (Signed)
 The test results show that your current treatment is OK, as the tests are stable.  Please continue the same plan.  There is no other need for change of treatment or further evaluation based on these results, at this time.  thanks

## 2024-03-17 NOTE — Progress Notes (Unsigned)
 Patient ID: Austin [name], male   DOB: 05-15-1941, 83 y.o.   MRN: [id number]         Chief Complaint:: wellness exam and htn, hld, PreDiabetes       HPI:  Austin [name] is a 83 y.o. male here for wellness exam; o/w up to date                        Also Pt denies chest pain, increased sob or doe, wheezing, orthopnea, PND, increased LE swelling, palpitations, dizziness or syncope.  Pt denies polydipsia, polyuria, or new focal neuro s/s.  Pt denies fever, wt loss, night sweats, loss of appetite, or other constitutional symptoms  Altace  was replaced with amlodipine  for BP due to elevated K.   Wt Readings from Last 3 Encounters:  03/17/24 187 lb (84.8 kg)  03/03/24 181 lb (82.1 kg)  12/25/23 186 lb (84.4 kg)   BP Readings from Last 3 Encounters:  03/17/24 122/72  03/03/24 122/72  12/25/23 122/78   Immunization History  Administered Date(s) Administered   Fluad Quad(high Dose 65+) 06/17/2019, 10/30/2023   Influenza Split 08/13/2011, 07/21/2012   Influenza, High Dose Seasonal PF 08/14/2017, 08/07/2018, 08/16/2021   Influenza,inj,Quad PF,6+ Mos 07/22/2013, 08/31/2014   Influenza-Unspecified 09/29/2015, 08/22/2016, 08/14/2017, 08/07/2018, 09/13/2020, 07/23/2022   PFIZER(Purple Top)SARS-COV-2 Vaccination 03/06/2020, 03/28/2020, 09/28/2020   Pfizer Covid-19 Vaccine Bivalent Booster 109yrs & up 08/16/2021   Pneumococcal Conjugate-13 07/22/2013   Pneumococcal Polysaccharide-23 06/17/2007, 07/21/2012   Td 11/23/1999, 06/29/2010   Tdap 02/22/2020   Zoster Recombinant(Shingrix) 05/14/2022, 07/23/2022   Zoster, Live 08/31/2014  There are no preventive care reminders to display for this patient.    Past Medical History:  Diagnosis Date   Allergy    Aortic atherosclerosis (HCC)    Cataract    forming left eye    Cholelithiasis    Hepatic steatosis    History of kidney stones    History of nephrolithiasis    Hypertension    Inguinal hernia    Internal hemorrhoids    Low back pain     Prostate cancer (HCC)    Renal cyst    Tubular adenoma of colon    Past Surgical History:  Procedure Laterality Date   COLONOSCOPY     HEMORRHOID SURGERY  age 37   POLYPECTOMY     PROSTATE BIOPSY     RADIOACTIVE SEED IMPLANT N/A 01/20/2020   Procedure: RADIOACTIVE SEED IMPLANT/BRACHYTHERAPY IMPLANT;  Surgeon: Adelbert Homans, MD;  Location: [facility];  Service: Urology;  Laterality: N/A;   SPACE OAR INSTILLATION N/A 01/20/2020   Procedure: SPACE OAR INSTILLATION;  Surgeon: Adelbert Homans, MD;  Location: [facility];  Service: Urology;  Laterality: N/A;    reports that he quit smoking about 59 years ago. His smoking use included cigarettes. He started smoking about 66 years ago. He has a 1.8 pack-year smoking history. He has been exposed to tobacco smoke. He has never used smokeless tobacco. He reports current alcohol use of about 3.0 standard drinks of alcohol per week. He reports that he does not use drugs. family history includes COPD in an other family member; Cancer in an other family member; Colon polyps in his maternal uncle; Diabetes in his mother; Heart disease in an other family member; Liver cancer (age of onset: 65) in his father; Psoriasis in an other family member. No Active Allergies Current Outpatient Medications on File Prior to Visit  Medication Sig  Dispense Refill   amLODipine  (NORVASC ) 5 MG tablet Take 1 tablet (5 mg total) by mouth daily. 90 tablet 3   tamsulosin  (FLOMAX ) 0.4 MG CAPS capsule Take 1 capsule (0.4 mg total) by mouth daily. 90 capsule 3   triamcinolone  cream (KENALOG ) 0.1 % APPLY TO AFFECTED AREA(S)  TOPICALLY TWICE DAILY 80 g 2   No current facility-administered medications on file prior to visit.        ROS:  All others reviewed and negative.  Objective        PE:  BP 122/72 (BP Location: Right Arm, Patient Position: Sitting, Cuff Size: Normal)   Pulse 62   Temp 97.8 F (36.6 C) (Oral)   Ht 6' (1.829  m)   Wt 187 lb (84.8 kg)   SpO2 98%   BMI 25.36 kg/m                 Constitutional: Pt appears in NAD               HENT: Head: NCAT.                Right Ear: External ear normal.                 Left Ear: External ear normal.                Eyes: . Pupils are equal, round, and reactive to light. Conjunctivae and EOM are normal               Nose: without d/c or deformity               Neck: Neck supple. Gross normal ROM               Cardiovascular: Normal rate and regular rhythm.                 Pulmonary/Chest: Effort normal and breath sounds without rales or wheezing.                Abd:  Soft, NT, ND, + BS, no organomegaly               Neurological: Pt is alert. At baseline orientation, motor grossly intact               Skin: Skin is warm. No rashes, no other new lesions, LE edema - none               Psychiatric: Pt behavior is normal without agitation   Micro: none  Cardiac tracings I have personally interpreted today:  none  Pertinent Radiological findings (summarize): none   Lab Results  Component Value Date   WBC 4.5 12/25/2023   HGB 13.6 12/25/2023   HCT 40.4 12/25/2023   PLT 163.0 12/25/2023   GLUCOSE 99 03/17/2024   CHOL 159 12/25/2023   TRIG 45.0 12/25/2023   HDL 88.10 12/25/2023   LDLCALC 62 12/25/2023   ALT 14 12/25/2023   AST 18 12/25/2023   NA 140 03/17/2024   K 4.1 03/17/2024   CL 105 03/17/2024   CREATININE 0.87 03/17/2024   BUN 15 03/17/2024   CO2 29 03/17/2024   TSH 2.26 12/25/2023   PSA 5.07 (H) 06/17/2019   INR 1.0 04/03/2021   HGBA1C 5.4 03/17/2024   Assessment/Plan:  Austin [name] is a 83 y.o. White or Caucasian [1] male with  has a past medical history of Allergy, Aortic atherosclerosis (HCC), Cataract, Cholelithiasis, Hepatic steatosis, History  of kidney stones, History of nephrolithiasis, Hypertension, Inguinal hernia, Internal hemorrhoids, Low back pain, Prostate cancer (HCC), Renal cyst, and Tubular adenoma of  colon.  Hyperkalemia Lab Results  Component Value Date   K 5.6 No hemolysis seen (H) 12/25/2023   Mild uncontrolled now off altace  5 mg - for recheck today  Encounter for well adult exam with abnormal findings Age and sex appropriate education and counseling updated with regular exercise and diet Referrals for preventative services - none needed Immunizations addressed - none needed Smoking counseling  - none needed Evidence for depression or other mood disorder - none significant Most recent labs reviewed. I have personally reviewed and have noted: 1) the patient's medical and social history 2) The patient's current medications and supplements 3) The patient's height, weight, and BMI have been recorded in the chart   Essential hypertension BP Readings from Last 3 Encounters:  03/17/24 122/72  03/03/24 122/72  12/25/23 122/78   Stable, pt to continue medical treatment norvasc  5 mg qd   Prediabetes Lab Results  Component Value Date   HGBA1C 5.4 03/17/2024   Stable, pt to continue current medical treatment  - diet, wt control  Followup: Return in about 6 months (around 09/16/2024).  Rosalia Colonel, MD 03/18/2024 12:05 PM [facility]

## 2024-03-18 ENCOUNTER — Encounter: Payer: Self-pay | Admitting: Internal Medicine

## 2024-03-18 NOTE — Assessment & Plan Note (Signed)

## 2024-03-18 NOTE — Assessment & Plan Note (Signed)
 Lab Results  Component Value Date   HGBA1C 5.4 03/17/2024   Stable, pt to continue current medical treatment  - diet, wt control

## 2024-03-18 NOTE — Assessment & Plan Note (Signed)
 BP Readings from Last 3 Encounters:  03/17/24 122/72  03/03/24 122/72  12/25/23 122/78   Stable, pt to continue medical treatment norvasc  5 mg qd

## 2024-05-14 ENCOUNTER — Ambulatory Visit: Payer: Self-pay

## 2024-05-14 NOTE — Telephone Encounter (Signed)
 FYI Only or Action Required?: FYI only for provider.  Patient was last seen in primary care on 03/17/2024 by Austin [name] ORN, MD.  Called Nurse Triage reporting Shortness of Breath.  Symptoms began several weeks ago.  Interventions attempted: Nothing.  Symptoms are: gradually worsening.  Triage Disposition: See HCP Within 4 Hours (Or PCP Triage)  Patient/caregiver understands and will follow disposition?: Yes, but will wait- refused scheduling with another provider today, prefers to see PCP. Went ahead and scheduled with PCP for 08/01 Copied from CRM #[id number]. Topic: Clinical - Red Word Triage >> May 14, 2024  9:53 AM [place] A wrote: [facility] that prompted transfer to Nurse Triage: Patient has body aches and shortness of breath Reason for Disposition  [1] MILD difficulty breathing (e.g., minimal/no SOB at rest, SOB with walking, pulse < 100) AND [2] NEW-onset or WORSE than normal  Answer Assessment - Initial Assessment Questions 1. RESPIRATORY STATUS: Describe your breathing? (e.g., wheezing, shortness of breath, unable to speak, severe coughing)      Heavy breathing, like not getting enough air  2. ONSET: When did this breathing problem begin?      1-2 weeks ago  3. PATTERN Does the difficult breathing come and go, or has it been constant since it started?      Seems to be constant, get out of breathi  4. SEVERITY: How bad is your breathing? (e.g., mild, moderate, severe)      Mild- moreso when getting up and moving   5. RECURRENT SYMPTOM: Have you had difficulty breathing before? If Yes, ask: When was the last time? and What happened that time?      No  6. CARDIAC HISTORY: Do you have any history of heart disease? (e.g., heart attack, angina, bypass surgery, angioplasty)      No  7. LUNG HISTORY: Do you have any history of lung disease?  (e.g., pulmonary embolus, asthma, emphysema)     No  8. CAUSE: What do you think is causing the breathing problem?      Thinks it may be related to blood pressure medication change  9. OTHER SYMPTOMS: Do you have any other symptoms? (e.g., chest pain, cough, dizziness, fever, runny nose)     Fatigue, dizziness, neck and shoulder discomfort, body aches  10. O2 SATURATION MONITOR:  Do you use an oxygen saturation monitor (pulse oximeter) at home? If Yes, ask: What is your reading (oxygen level) today? What is your usual oxygen saturation reading? (e.g., 95%)       No  11. PREGNANCY: Is there any chance you are pregnant? When was your last menstrual period?       No  12. TRAVEL: Have you traveled out of the country in the last month? (e.g., travel history, exposures)       No  Protocols used: Breathing Difficulty-A-AH

## 2024-05-15 ENCOUNTER — Encounter: Payer: Self-pay | Admitting: Internal Medicine

## 2024-05-15 ENCOUNTER — Ambulatory Visit (INDEPENDENT_AMBULATORY_CARE_PROVIDER_SITE_OTHER)

## 2024-05-15 ENCOUNTER — Ambulatory Visit: Payer: Self-pay | Admitting: Internal Medicine

## 2024-05-15 ENCOUNTER — Ambulatory Visit: Admitting: Internal Medicine

## 2024-05-15 VITALS — BP 122/78 | HR 61 | Temp 97.9°F | Ht 72.0 in | Wt 183.0 lb

## 2024-05-15 DIAGNOSIS — R197 Diarrhea, unspecified: Secondary | ICD-10-CM

## 2024-05-15 DIAGNOSIS — I10 Essential (primary) hypertension: Secondary | ICD-10-CM

## 2024-05-15 DIAGNOSIS — Z125 Encounter for screening for malignant neoplasm of prostate: Secondary | ICD-10-CM

## 2024-05-15 DIAGNOSIS — R7303 Prediabetes: Secondary | ICD-10-CM | POA: Diagnosis not present

## 2024-05-15 DIAGNOSIS — R0600 Dyspnea, unspecified: Secondary | ICD-10-CM | POA: Diagnosis not present

## 2024-05-15 DIAGNOSIS — K921 Melena: Secondary | ICD-10-CM

## 2024-05-15 DIAGNOSIS — R194 Change in bowel habit: Secondary | ICD-10-CM

## 2024-05-15 DIAGNOSIS — E538 Deficiency of other specified B group vitamins: Secondary | ICD-10-CM | POA: Diagnosis not present

## 2024-05-15 DIAGNOSIS — Z0000 Encounter for general adult medical examination without abnormal findings: Secondary | ICD-10-CM | POA: Diagnosis not present

## 2024-05-15 DIAGNOSIS — E559 Vitamin D deficiency, unspecified: Secondary | ICD-10-CM | POA: Diagnosis not present

## 2024-05-15 DIAGNOSIS — R739 Hyperglycemia, unspecified: Secondary | ICD-10-CM

## 2024-05-15 LAB — PSA: PSA: 0.11 ng/mL (ref 0.10–4.00)

## 2024-05-15 LAB — LIPID PANEL
Cholesterol: 122 mg/dL (ref 0–200)
HDL: 60 mg/dL (ref >39.00)
LDL Cholesterol: 53 mg/dL (ref 0–99)
NonHDL: 62.35
Total CHOL/HDL Ratio: 2
Triglycerides: 46 mg/dL (ref 0.0–149.0)
VLDL: 9.2 mg/dL (ref 0.0–40.0)

## 2024-05-15 LAB — VITAMIN D 25 HYDROXY (VIT D DEFICIENCY, FRACTURES): VITD: 44.53 ng/mL (ref 30.00–100.00)

## 2024-05-15 LAB — URINALYSIS, ROUTINE W REFLEX MICROSCOPIC
Bilirubin Urine: NEGATIVE
Hgb urine dipstick: NEGATIVE
Ketones, ur: NEGATIVE
Leukocytes,Ua: NEGATIVE
Nitrite: NEGATIVE
Specific Gravity, Urine: 1.015 (ref 1.000–1.030)
Total Protein, Urine: NEGATIVE
Urine Glucose: NEGATIVE
Urobilinogen, UA: 0.2 (ref 0.0–1.0)
pH: 6 (ref 5.0–8.0)

## 2024-05-15 LAB — TSH: TSH: 2.1 u[IU]/mL (ref 0.35–5.50)

## 2024-05-15 LAB — CBC WITH DIFFERENTIAL/PLATELET
Basophils Absolute: 0 K/uL (ref 0.0–0.1)
Basophils Relative: 0.4 % (ref 0.0–3.0)
Eosinophils Absolute: 0.1 K/uL (ref 0.0–0.7)
Eosinophils Relative: 3.5 % (ref 0.0–5.0)
HCT: 43 % (ref 39.0–52.0)
Hemoglobin: 14.6 g/dL (ref 13.0–17.0)
Lymphocytes Relative: 18.3 % (ref 12.0–46.0)
Lymphs Abs: 0.5 K/uL — ABNORMAL LOW (ref 0.7–4.0)
MCHC: 34 g/dL (ref 30.0–36.0)
MCV: 91.6 fl (ref 78.0–100.0)
Monocytes Absolute: 0.4 K/uL (ref 0.1–1.0)
Monocytes Relative: 17.1 % — ABNORMAL HIGH (ref 3.0–12.0)
Neutro Abs: 1.5 K/uL (ref 1.4–7.7)
Neutrophils Relative %: 60.7 % (ref 43.0–77.0)
Platelets: 142 K/uL — ABNORMAL LOW (ref 150.0–400.0)
RBC: 4.7 Mil/uL (ref 4.22–5.81)
RDW: 13.1 % (ref 11.5–15.5)
WBC: 2.5 K/uL — ABNORMAL LOW (ref 4.0–10.5)

## 2024-05-15 LAB — BASIC METABOLIC PANEL WITH GFR
BUN: 17 mg/dL (ref 6–23)
CO2: 28 meq/L (ref 19–32)
Calcium: 9.2 mg/dL (ref 8.4–10.5)
Chloride: 106 meq/L (ref 96–112)
Creatinine, Ser: 0.78 mg/dL (ref 0.40–1.50)
GFR: 84.09 mL/min (ref >60.00)
Glucose, Bld: 99 mg/dL (ref 70–99)
Potassium: 5 meq/L (ref 3.5–5.1)
Sodium: 142 meq/L (ref 135–145)

## 2024-05-15 LAB — VITAMIN B12: Vitamin B-12: 1284 pg/mL — ABNORMAL HIGH (ref 211–911)

## 2024-05-15 LAB — HEPATIC FUNCTION PANEL
ALT: 14 U/L (ref 0–53)
AST: 19 U/L (ref 0–37)
Albumin: 4.4 g/dL (ref 3.5–5.2)
Alkaline Phosphatase: 64 U/L (ref 39–117)
Bilirubin, Direct: 0.1 mg/dL (ref 0.0–0.3)
Total Bilirubin: 0.6 mg/dL (ref 0.2–1.2)
Total Protein: 6.7 g/dL (ref 6.0–8.3)

## 2024-05-15 LAB — BRAIN NATRIURETIC PEPTIDE: Pro B Natriuretic peptide (BNP): 40 pg/mL (ref 0.0–100.0)

## 2024-05-15 LAB — HEMOGLOBIN A1C: Hgb A1c MFr Bld: 6.1 % (ref 4.6–6.5)

## 2024-05-15 MED ORDER — LEVOFLOXACIN 500 MG PO TABS: 500.0000 mg | ORAL_TABLET | Freq: Every day | ORAL | 0 refills | Status: DC

## 2024-05-15 NOTE — Patient Instructions (Signed)
 You have evidence for possible left lower pneumonia (not likely viral) - Please take all new medication as prescribed - the antibiotic  You can also take Delsym OTC for cough, and/or Mucinex (or it's generic off brand) for congestion, and tylenol  as needed for pain.  Please continue all other medications as before, and refills have been done if requested.  Please have the pharmacy call with any other refills you may need.  Please keep your appointments with your specialists as you may have planned  Please go to the XRAY Department in the first floor for the x-ray testing  Please go to the LAB at the blood drawing area for the tests to be done  You will be contacted by phone if any changes need to be made immediately.  Otherwise, you will receive a letter about your results with an explanation, but please check with MyChart first.  Please make an Appointment to return in 3 months, or sooner if needed

## 2024-05-15 NOTE — Progress Notes (Signed)
 The test results show that your current treatment is OK, as the tests are stable.  Please continue the same plan.  There is no other need for change of treatment or further evaluation based on these results, at this time.  thanks

## 2024-05-15 NOTE — Progress Notes (Signed)
 Patient ID: Austin [name], male   DOB: [birth year], 83 y.o.   MRN: [id number]        Chief Complaint: follow up cough, sob, dizziness, low stamina, myalgias all over and more specific right post neck pain, and nausea, preDM, also recent loose stools for unclear reason and Denies worsening reflux, abd pain, dysphagia,       HPI:  Austin [name] is a 83 y.o. male here with constellation of symptoms above x 2-3 days, appears mild ill ; has researched his symptoms online and he suggests he may have lyme disease but no recent tick exposure, or some viral illness.  Has somewhat slowed cognition and mildly unsteady to stand and walk.  No falls.  Pt denies chest pain, wheezing, orthopnea, PND, increased LE swelling, palpitations, or syncope. Pt denies polydipsia, polyuria, or new focal neuro s/s.           Wt Readings from Last 3 Encounters:  05/15/24 183 lb (83 kg)  03/17/24 187 lb (84.8 kg)  03/03/24 181 lb (82.1 kg)   BP Readings from Last 3 Encounters:  05/15/24 122/78  03/17/24 122/72  03/03/24 122/72         Past Medical History:  Diagnosis Date   Allergy    Aortic atherosclerosis (HCC)    Cataract    forming left eye    Cholelithiasis    Hepatic steatosis    History of kidney stones    History of nephrolithiasis    Hypertension    Inguinal hernia    Internal hemorrhoids    Low back pain    Prostate cancer (HCC)    Renal cyst    Tubular adenoma of colon    Past Surgical History:  Procedure Laterality Date   COLONOSCOPY     HEMORRHOID SURGERY  age 59   POLYPECTOMY     PROSTATE BIOPSY     RADIOACTIVE SEED IMPLANT N/A 01/20/2020   Procedure: RADIOACTIVE SEED IMPLANT/BRACHYTHERAPY IMPLANT;  Surgeon: Devere Lonni Righter, MD;  Location: [facility];  Service: Urology;  Laterality: N/A;   SPACE OAR INSTILLATION N/A 01/20/2020   Procedure: SPACE OAR INSTILLATION;  Surgeon: Devere Lonni Righter, MD;  Location: [facility];  Service: Urology;   Laterality: N/A;    reports that he quit smoking about 59 years ago. His smoking use included cigarettes. He started smoking about 66 years ago. He has a 1.8 pack-year smoking history. He has been exposed to tobacco smoke. He has never used smokeless tobacco. He reports current alcohol use of about 3.0 standard drinks of alcohol per week. He reports that he does not use drugs. family history includes COPD in an other family member; Cancer in an other family member; Colon polyps in his maternal uncle; Diabetes in his mother; Heart disease in an other family member; Liver cancer (age of onset: 55) in his father; Psoriasis in an other family member. No Active Allergies Current Outpatient Medications on File Prior to Visit  Medication Sig Dispense Refill   amLODipine  (NORVASC ) 5 MG tablet Take 1 tablet (5 mg total) by mouth daily. 90 tablet 3   tamsulosin  (FLOMAX ) 0.4 MG CAPS capsule Take 1 capsule (0.4 mg total) by mouth daily. 90 capsule 3   triamcinolone  cream (KENALOG ) 0.1 % APPLY TO AFFECTED AREA(S)  TOPICALLY TWICE DAILY 80 g 2   No current facility-administered medications on file prior to visit.        ROS:  All others reviewed and negative.  Objective        PE:  BP 122/78   Pulse 61   Temp 97.9 F (36.6 C)   Ht 6' (1.829 m)   Wt 183 lb (83 kg)   SpO2 98%   BMI 24.82 kg/m                 Constitutional: Pt appears mild ill, fatigued, slower motion and cognition               HENT: Head: NCAT.                Right Ear: External ear normal.                 Left Ear: External ear normal.                Eyes: . Pupils are equal, round, and reactive to light. Conjunctivae and EOM are normal               Nose: without d/c or deformity               Neck: Neck supple. Gross normal ROM               Cardiovascular: Normal rate and regular rhythm.                 Pulmonary/Chest: Effort normal and breath sounds with left lower lung field rales but no wheezing.                Abd:   Soft, NT, ND, + BS, no organomegaly               Neurological: Pt is alert. At baseline orientation, motor grossly intact               Skin: Skin is warm. No rashes, no other new lesions, LE edema - trace left ankle               Psychiatric: Pt behavior is normal without agitation   Micro: none  Cardiac tracings I have personally interpreted today:  none  Pertinent Radiological findings (summarize): none   Lab Results  Component Value Date   WBC 2.5 (L) 05/15/2024   HGB 14.6 05/15/2024   HCT 43.0 05/15/2024   PLT 142.0 (L) 05/15/2024   GLUCOSE 99 05/15/2024   CHOL 122 05/15/2024   TRIG 46.0 05/15/2024   HDL 60.00 05/15/2024   LDLCALC 53 05/15/2024   ALT 14 05/15/2024   AST 19 05/15/2024   NA 142 05/15/2024   K 5.0 05/15/2024   CL 106 05/15/2024   CREATININE 0.78 05/15/2024   BUN 17 05/15/2024   CO2 28 05/15/2024   TSH 2.10 05/15/2024   PSA 0.11 05/15/2024   INR 1.0 04/03/2021   HGBA1C 6.1 05/15/2024   Assessment/Plan:  Austin [name] is a 83 y.o. White or Caucasian [1] male with  has a past medical history of Allergy, Aortic atherosclerosis (HCC), Cataract, Cholelithiasis, Hepatic steatosis, History of kidney stones, History of nephrolithiasis, Hypertension, Inguinal hernia, Internal hemorrhoids, Low back pain, Prostate cancer (HCC), Renal cyst, and Tubular adenoma of colon.  Prediabetes Lab Results  Component Value Date   HGBA1C 6.1 05/15/2024   Stable, pt to continue current medical treatment  - diet, wt control   Essential hypertension BP Readings from Last 3 Encounters:  05/15/24 122/78  03/17/24 122/72  03/03/24 122/72   Stable, pt to continue medical treatment norvasc  5 mg qd  Diarrhea Also for GI panel  Dyspnea With cough but no fever, pain; but does have few LLL rales - likely c/w CAP - for levaquin  500 mg every day course  Followup: Return in about 3 months (around 08/15/2024).  Lynwood Rush, MD 05/16/2024 5:09 PM [facility]

## 2024-05-16 ENCOUNTER — Encounter: Payer: Self-pay | Admitting: Internal Medicine

## 2024-05-16 DIAGNOSIS — R197 Diarrhea, unspecified: Secondary | ICD-10-CM | POA: Insufficient documentation

## 2024-05-16 DIAGNOSIS — R0600 Dyspnea, unspecified: Secondary | ICD-10-CM | POA: Insufficient documentation

## 2024-05-16 LAB — D-DIMER, QUANTITATIVE: D-Dimer, Quant: 0.34 ug{FEU}/mL (ref <0.50)

## 2024-05-16 NOTE — Progress Notes (Signed)
 The test results show that your current treatment is OK, as the tests are stable.  Please continue the same plan.  There is no other need for change of treatment or further evaluation based on these results, at this time.  thanks

## 2024-05-16 NOTE — Assessment & Plan Note (Signed)
 Lab Results  Component Value Date   HGBA1C 6.1 05/15/2024   Stable, pt to continue current medical treatment  - diet, wt control

## 2024-05-16 NOTE — Assessment & Plan Note (Signed)
 BP Readings from Last 3 Encounters:  05/15/24 122/78  03/17/24 122/72  03/03/24 122/72   Stable, pt to continue medical treatment norvasc  5 mg qd

## 2024-05-16 NOTE — Assessment & Plan Note (Signed)
 With cough but no fever, pain; but does have few LLL rales - likely c/w CAP - for levaquin  500 mg every day course

## 2024-05-16 NOTE — Assessment & Plan Note (Signed)
 Also for GI panel

## 2024-05-21 ENCOUNTER — Other Ambulatory Visit: Payer: Self-pay | Admitting: Internal Medicine

## 2024-07-31 ENCOUNTER — Encounter: Payer: Self-pay | Admitting: Internal Medicine

## 2024-08-20 ENCOUNTER — Encounter: Payer: Self-pay | Admitting: Internal Medicine

## 2024-09-07 ENCOUNTER — Encounter: Payer: Self-pay | Admitting: Internal Medicine

## 2024-10-28 ENCOUNTER — Other Ambulatory Visit: Payer: Self-pay | Admitting: Internal Medicine

## 2024-10-28 ENCOUNTER — Other Ambulatory Visit: Payer: Self-pay

## 2024-10-29 ENCOUNTER — Ambulatory Visit: Payer: Self-pay | Admitting: Internal Medicine

## 2024-10-29 ENCOUNTER — Encounter: Payer: Self-pay | Admitting: Internal Medicine

## 2024-10-29 ENCOUNTER — Ambulatory Visit: Admitting: Internal Medicine

## 2024-10-29 VITALS — BP 122/70 | HR 65 | Temp 98.0°F | Ht 72.0 in | Wt 177.0 lb

## 2024-10-29 DIAGNOSIS — I10 Essential (primary) hypertension: Secondary | ICD-10-CM | POA: Diagnosis not present

## 2024-10-29 DIAGNOSIS — R7303 Prediabetes: Secondary | ICD-10-CM

## 2024-10-29 DIAGNOSIS — N400 Enlarged prostate without lower urinary tract symptoms: Secondary | ICD-10-CM

## 2024-10-29 LAB — CBC WITH DIFFERENTIAL/PLATELET
Basophils Absolute: 0 K/uL (ref 0.0–0.1)
Basophils Relative: 1 % (ref 0.0–3.0)
Eosinophils Absolute: 0.1 K/uL (ref 0.0–0.7)
Eosinophils Relative: 4.2 % (ref 0.0–5.0)
HCT: 40.2 % (ref 39.0–52.0)
Hemoglobin: 13.7 g/dL (ref 13.0–17.0)
Lymphocytes Relative: 19.4 % (ref 12.0–46.0)
Lymphs Abs: 0.6 K/uL — ABNORMAL LOW (ref 0.7–4.0)
MCHC: 34.2 g/dL (ref 30.0–36.0)
MCV: 93.9 fl (ref 78.0–100.0)
Monocytes Absolute: 0.4 K/uL (ref 0.1–1.0)
Monocytes Relative: 11.2 % (ref 3.0–12.0)
Neutro Abs: 2.1 K/uL (ref 1.4–7.7)
Neutrophils Relative %: 64.2 % (ref 43.0–77.0)
Platelets: 151 K/uL (ref 150.0–400.0)
RBC: 4.28 Mil/uL (ref 4.22–5.81)
RDW: 13.3 % (ref 11.5–15.5)
WBC: 3.3 K/uL — ABNORMAL LOW (ref 4.0–10.5)

## 2024-10-29 LAB — BASIC METABOLIC PANEL WITH GFR
BUN: 14 mg/dL (ref 6–23)
CO2: 31 meq/L (ref 19–32)
Calcium: 9.2 mg/dL (ref 8.4–10.5)
Chloride: 105 meq/L (ref 96–112)
Creatinine, Ser: 0.96 mg/dL (ref 0.40–1.50)
GFR: 74.29 mL/min
Glucose, Bld: 98 mg/dL (ref 70–99)
Potassium: 4.4 meq/L (ref 3.5–5.1)
Sodium: 139 meq/L (ref 135–145)

## 2024-10-29 LAB — LIPID PANEL
Cholesterol: 156 mg/dL (ref 28–200)
HDL: 78.1 mg/dL
LDL Cholesterol: 71 mg/dL (ref 10–99)
NonHDL: 78.25
Total CHOL/HDL Ratio: 2
Triglycerides: 37 mg/dL (ref 10.0–149.0)
VLDL: 7.4 mg/dL (ref 0.0–40.0)

## 2024-10-29 LAB — HEPATIC FUNCTION PANEL
ALT: 11 U/L (ref 3–53)
AST: 16 U/L (ref 5–37)
Albumin: 4.3 g/dL (ref 3.5–5.2)
Alkaline Phosphatase: 43 U/L (ref 39–117)
Bilirubin, Direct: 0.1 mg/dL (ref 0.1–0.3)
Total Bilirubin: 0.7 mg/dL (ref 0.2–1.2)
Total Protein: 6.7 g/dL (ref 6.0–8.3)

## 2024-10-29 LAB — HEMOGLOBIN A1C: Hgb A1c MFr Bld: 5.4 % (ref 4.6–6.5)

## 2024-10-29 MED ORDER — TAMSULOSIN HCL 0.4 MG PO CAPS: 0.4000 mg | ORAL_CAPSULE | Freq: Every day | ORAL | 3 refills | Status: AC

## 2024-10-29 MED ORDER — AMLODIPINE BESYLATE 5 MG PO TABS: 5.0000 mg | ORAL_TABLET | Freq: Every day | ORAL | 3 refills | Status: AC

## 2024-10-29 NOTE — Progress Notes (Signed)
 The test results show that your current treatment is OK, as the tests are stable.  Please continue the same plan.  There is no other need for change of treatment or further evaluation based on these results, at this time.  thanks

## 2024-10-29 NOTE — Patient Instructions (Signed)

## 2024-10-29 NOTE — Assessment & Plan Note (Signed)
 Pt to continue f/u with urology, stable with flomax  1 qd

## 2024-10-29 NOTE — Assessment & Plan Note (Signed)
 Lab Results  Component Value Date   HGBA1C 6.1 05/15/2024   Stable, pt to continue current medical treatment  - diet, wt control

## 2024-10-29 NOTE — Assessment & Plan Note (Signed)
 BP Readings from Last 3 Encounters:  10/29/24 122/70  05/15/24 122/78  03/17/24 122/72   Stable, pt to continue medical treatment norvasc  5 qd

## 2024-10-29 NOTE — Progress Notes (Signed)
 Patient ID: Austin [name], male   DOB: 1941-06-24, 84 y.o.   MRN: [id number]        Chief Complaint: follow up HTN, PreDM       HPI:  Austin [name] is a 84 y.o. male here overall doing well, cont's to follow with urology with hx of prostate ca and BPH, Pt denies chest pain, increased sob or doe, wheezing, orthopnea, PND, increased LE swelling, palpitations, dizziness or syncope.   Pt denies polydipsia, polyuria, or new focal neuro s/s.   Pt denies fever, wt loss, night sweats, loss of appetite, or other constitutional symptoms   Did have LLL Pna last visit aug 2025 but recovered without complication with levaquin  course. Denies urinary symptoms such as dysuria, frequency, urgency, flank pain, hematuria or n/v, fever, chills.       Wt Readings from Last 3 Encounters:  10/29/24 177 lb (80.3 kg)  05/15/24 183 lb (83 kg)  03/17/24 187 lb (84.8 kg)   BP Readings from Last 3 Encounters:  10/29/24 122/70  05/15/24 122/78  03/17/24 122/72         Past Medical History:  Diagnosis Date   Allergy    Aortic atherosclerosis    Cataract    forming left eye    Cholelithiasis    Hepatic steatosis    History of kidney stones    History of nephrolithiasis    Hypertension    Inguinal hernia    Internal hemorrhoids    Low back pain    Prostate cancer (HCC)    Renal cyst    Tubular adenoma of colon    Past Surgical History:  Procedure Laterality Date   COLONOSCOPY     HEMORRHOID SURGERY  age 27   POLYPECTOMY     PROSTATE BIOPSY     RADIOACTIVE SEED IMPLANT N/A 01/20/2020   Procedure: RADIOACTIVE SEED IMPLANT/BRACHYTHERAPY IMPLANT;  Surgeon: Devere Lonni Righter, MD;  Location: [facility];  Service: Urology;  Laterality: N/A;   SPACE OAR INSTILLATION N/A 01/20/2020   Procedure: SPACE OAR INSTILLATION;  Surgeon: Devere Lonni Righter, MD;  Location: [facility];  Service: Urology;  Laterality: N/A;    reports that he quit smoking about 60 years ago. His  smoking use included cigarettes. He started smoking about 67 years ago. He has a 1.8 pack-year smoking history. He has been exposed to tobacco smoke. He has never used smokeless tobacco. He reports current alcohol use of about 3.0 standard drinks of alcohol per week. He reports that he does not use drugs. family history includes COPD in an other family member; Cancer in an other family member; Colon polyps in his maternal uncle; Diabetes in his mother; Heart disease in an other family member; Liver cancer (age of onset: 7) in his father; Psoriasis in an other family member. Allergies[1] Medications Ordered Prior to Encounter[2]      ROS:  All others reviewed and negative.  Objective        PE:  BP 122/70 (BP Location: Right Arm, Patient Position: Sitting, Cuff Size: Normal)   Pulse 65   Temp 98 F (36.7 C) (Oral)   Ht 6' (1.829 m)   Wt 177 lb (80.3 kg)   SpO2 99%   BMI 24.01 kg/m                 Constitutional: Pt appears in NAD               HENT: Head: NCAT.  Right Ear: External ear normal.                 Left Ear: External ear normal.                Eyes: . Pupils are equal, round, and reactive to light. Conjunctivae and EOM are normal               Nose: without d/c or deformity               Neck: Neck supple. Gross normal ROM               Cardiovascular: Normal rate and regular rhythm.                 Pulmonary/Chest: Effort normal and breath sounds without rales or wheezing.                               Neurological: Pt is alert. At baseline orientation, motor grossly intact               Skin: Skin is warm. No rashes, no other new lesions, LE edema - none               Psychiatric: Pt behavior is normal without agitation   Micro: none  Cardiac tracings I have personally interpreted today:  none  Pertinent Radiological findings (summarize): none   Lab Results  Component Value Date   WBC 2.5 (L) 05/15/2024   HGB 14.6 05/15/2024   HCT 43.0 05/15/2024    PLT 142.0 (L) 05/15/2024   GLUCOSE 99 05/15/2024   CHOL 122 05/15/2024   TRIG 46.0 05/15/2024   HDL 60.00 05/15/2024   LDLCALC 53 05/15/2024   ALT 14 05/15/2024   AST 19 05/15/2024   NA 142 05/15/2024   K 5.0 05/15/2024   CL 106 05/15/2024   CREATININE 0.78 05/15/2024   BUN 17 05/15/2024   CO2 28 05/15/2024   TSH 2.10 05/15/2024   PSA 0.11 05/15/2024   INR 1.0 04/03/2021   HGBA1C 6.1 05/15/2024   Assessment/Plan:  Austin [name] is a 84 y.o. White or Caucasian [1] male with  has a past medical history of Allergy, Aortic atherosclerosis, Cataract, Cholelithiasis, Hepatic steatosis, History of kidney stones, History of nephrolithiasis, Hypertension, Inguinal hernia, Internal hemorrhoids, Low back pain, Prostate cancer (HCC), Renal cyst, and Tubular adenoma of colon.  Prediabetes Lab Results  Component Value Date   HGBA1C 6.1 05/15/2024   Stable, pt to continue current medical treatment  - diet, wt control   Essential hypertension BP Readings from Last 3 Encounters:  10/29/24 122/70  05/15/24 122/78  03/17/24 122/72   Stable, pt to continue medical treatment norvasc  5 qd   BPH (benign prostatic hyperplasia) Pt to continue f/u with urology, stable with flomax  1 qd  Followup: Return in about 6 months (around 04/28/2025).  Austin Rush, MD 10/29/2024 10:34 AM [facility]  Primary Care - [facility] Internal Medicine    [1] No Active Allergies [2]  Current Outpatient Medications on File Prior to Visit  Medication Sig Dispense Refill   triamcinolone  cream (KENALOG ) 0.1 % APPLY TO AFFECTED AREA(S)  TOPICALLY TWICE DAILY 80 g 2   No current facility-administered medications on file prior to visit.

## 2025-03-19 ENCOUNTER — Ambulatory Visit

## 2025-03-19 ENCOUNTER — Encounter: Admitting: Internal Medicine
